# Patient Record
Sex: Male | Born: 1965 | Race: White | Hispanic: No | Marital: Married | Smoking: Former smoker
Health system: Southern US, Community
[De-identification: ages and names within clinical notes are randomized; demographics above are authoritative.]

## PROBLEM LIST (undated history)

## (undated) DIAGNOSIS — E785 Hyperlipidemia, unspecified: Secondary | ICD-10-CM

## (undated) DIAGNOSIS — G43909 Migraine, unspecified, not intractable, without status migrainosus: Secondary | ICD-10-CM

## (undated) DIAGNOSIS — J45909 Unspecified asthma, uncomplicated: Secondary | ICD-10-CM

## (undated) DIAGNOSIS — I10 Essential (primary) hypertension: Secondary | ICD-10-CM

## (undated) DIAGNOSIS — B029 Zoster without complications: Secondary | ICD-10-CM

## (undated) DIAGNOSIS — K219 Gastro-esophageal reflux disease without esophagitis: Secondary | ICD-10-CM

## (undated) DIAGNOSIS — F17200 Nicotine dependence, unspecified, uncomplicated: Secondary | ICD-10-CM

## (undated) DIAGNOSIS — K5792 Diverticulitis of intestine, part unspecified, without perforation or abscess without bleeding: Secondary | ICD-10-CM

## (undated) HISTORY — DX: Hyperlipidemia, unspecified: E78.5

## (undated) HISTORY — DX: Unspecified asthma, uncomplicated: J45.909

## (undated) HISTORY — DX: Essential (primary) hypertension: I10

## (undated) HISTORY — DX: Diverticulitis of intestine, part unspecified, without perforation or abscess without bleeding: K57.92

## (undated) HISTORY — DX: Gastro-esophageal reflux disease without esophagitis: K21.9

## (undated) HISTORY — DX: Nicotine dependence, unspecified, uncomplicated: F17.200

## (undated) HISTORY — DX: Migraine, unspecified, not intractable, without status migrainosus: G43.909

## (undated) HISTORY — DX: Zoster without complications: B02.9

---
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
## 2001-03-20 ENCOUNTER — Other Ambulatory Visit: Admission: RE | Admit: 2001-03-20 | Discharge: 2001-03-20 | Payer: Self-pay | Admitting: Family Medicine

## 2003-08-07 ENCOUNTER — Emergency Department (HOSPITAL_COMMUNITY): Admission: EM | Admit: 2003-08-07 | Discharge: 2003-08-07 | Payer: Self-pay | Admitting: Family Medicine

## 2003-11-09 ENCOUNTER — Ambulatory Visit (HOSPITAL_COMMUNITY): Admission: RE | Admit: 2003-11-09 | Discharge: 2003-11-09 | Payer: Self-pay | Admitting: Family Medicine

## 2004-08-15 ENCOUNTER — Ambulatory Visit: Payer: Self-pay | Admitting: Internal Medicine

## 2004-10-13 ENCOUNTER — Encounter: Payer: Self-pay | Admitting: Internal Medicine

## 2004-10-13 ENCOUNTER — Ambulatory Visit: Payer: Self-pay | Admitting: Internal Medicine

## 2004-10-13 ENCOUNTER — Ambulatory Visit (HOSPITAL_COMMUNITY): Admission: RE | Admit: 2004-10-13 | Discharge: 2004-10-13 | Payer: Self-pay | Admitting: Internal Medicine

## 2005-03-13 ENCOUNTER — Ambulatory Visit: Payer: Self-pay | Admitting: Internal Medicine

## 2006-03-14 ENCOUNTER — Ambulatory Visit: Payer: Self-pay | Admitting: Internal Medicine

## 2006-04-19 ENCOUNTER — Ambulatory Visit (HOSPITAL_COMMUNITY): Admission: RE | Admit: 2006-04-19 | Discharge: 2006-04-19 | Payer: Self-pay | Admitting: General Surgery

## 2009-06-19 ENCOUNTER — Emergency Department (HOSPITAL_COMMUNITY): Admission: EM | Admit: 2009-06-19 | Discharge: 2009-06-19 | Payer: Self-pay | Admitting: Emergency Medicine

## 2010-04-03 LAB — BASIC METABOLIC PANEL
BUN: 10 mg/dL (ref 6–23)
CO2: 28 mEq/L (ref 19–32)
Calcium: 9.4 mg/dL (ref 8.4–10.5)
Chloride: 104 mEq/L (ref 96–112)
Creatinine, Ser: 1.17 mg/dL (ref 0.4–1.5)
GFR calc Af Amer: >60 mL/min (ref >60)
GFR calc non Af Amer: >60 mL/min (ref >60)
Glucose, Bld: 82 mg/dL (ref 70–99)
Potassium: 3.6 mEq/L (ref 3.5–5.1)
Sodium: 139 mEq/L (ref 135–145)

## 2010-04-03 LAB — DIFFERENTIAL
Basophils Absolute: 0.1 10*3/uL (ref 0.0–0.1)
Basophils Relative: 1 % (ref 0–1)
Eosinophils Absolute: 0.2 10*3/uL (ref 0.0–0.7)
Eosinophils Relative: 2 % (ref 0–5)
Lymphocytes Relative: 25 % (ref 12–46)
Lymphs Abs: 1.8 10*3/uL (ref 0.7–4.0)
Monocytes Absolute: 0.8 10*3/uL (ref 0.1–1.0)
Monocytes Relative: 10 % (ref 3–12)
Neutro Abs: 4.5 10*3/uL (ref 1.7–7.7)
Neutrophils Relative %: 61 % (ref 43–77)

## 2010-04-03 LAB — CBC
HCT: 35.8 % — ABNORMAL LOW (ref 39.0–52.0)
Hemoglobin: 11.6 g/dL — ABNORMAL LOW (ref 13.0–17.0)
MCHC: 32.3 g/dL (ref 30.0–36.0)
MCV: 79.1 fL (ref 78.0–100.0)
Platelets: 250 10*3/uL (ref 150–400)
RBC: 4.53 MIL/uL (ref 4.22–5.81)
RDW: 15 % (ref 11.5–15.5)
WBC: 7.3 10*3/uL (ref 4.0–10.5)

## 2010-04-03 LAB — POCT CARDIAC MARKERS
CKMB, poc: <1 ng/mL — ABNORMAL LOW (ref 1.0–8.0)
Myoglobin, poc: 64 ng/mL (ref 12–200)
Troponin i, poc: <0.05 ng/mL (ref 0.00–0.09)
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
## 2010-06-02 NOTE — Op Note (Signed)
NAME:  Noah Wright, Noah Wright                 ACCOUNT NO.:  [id number]   MEDICAL RECORD NO.:  [id number]          PATIENT TYPE:  AMB   LOCATION:  DAY                           FACILITY:  [facility]   PHYSICIAN:  R. Roetta Sessions, M.D. DATE OF BIRTH:  1965/08/15   DATE OF PROCEDURE:  10/13/2004  DATE OF DISCHARGE:                                 OPERATIVE REPORT   PROCEDURES:  Diagnostic EGD followed by colonoscopy with snare polypectomy.   INDICATIONS FOR PROCEDURE:  The patient is a 45 year old gentleman with  longstanding gastroesophageal reflux disease and vague intermittent  esophageal dysphagia.  He also has intermittent low volume hematochezia.  He  was seen in our office the first of August but put off the endoscopic  evaluation until now.   EGD and colonoscopy have been discussed with Noah Wright at length.  Potential risks, benefits, and alternatives have been reviewed, questions  answered.  He is agreeable.  Please see the documentation in the medical  record.   PROCEDURE NOTE:  O2 saturations, blood pressure, pulse, and respirations  were monitored throughout the entire procedure.  Conscious sedation of IV  Versed and Demerol in incremental doses.  Cetacaine spray for topical  pharyngeal anesthesia.   INSTRUMENT:  Olympus video chip system.   FINDINGS:  EG examination of the tubular esophagus revealed 4 quadrant  distal esophageal erosions coming up a good 5-6 cm in tubal esophagus.  EG  junction was patulous.  There was no Barrett's esophagus, no evidence of  tumor, ring, or stricture.  EG junction was easily traversed with the scope.   STOMACH:  The gastric cavity was emptied, insufflated well with air.  A  thorough examination of the gastric mucosa, including retroflexed view of  the proximal stomach and esophagogastric junction demonstrated only a small  hiatal hernia.  The pylorus patent, easily traversed.  Examination of the  bulb and second portion revealed bulbar erosions,  otherwise D1, D2 appeared  normal.   THERAPY/DIAGNOSTIC MANEUVERS PERFORMED:  None.   The patient tolerated the procedure well and was prepared for colonoscopy.   Digital rectal exam revealed no abnormalities.   ENDOSCOPIC FINDINGS:  The prep was good.   RECTUM:  Examination of the rectal mucosa including retroflexed view in the  anal verge revealed a 5 mm pedunculated polyp at 15 cm in from the anal  verge; otherwise the rectal mucosa appeared normal.   COLON:  Colonic mucosa was surveyed from the rectosigmoid junction through  the left transverse and right colon to the area of the appendiceal orifice,  ileocecal valve, and cecum.  These structures were well-seen and  photographed for the record.  From this level, the scope was slowly  withdrawn.  The previously mentioned mucosal surfaces, I did not see any  clot; mucosa appeared normal.  The polyp in the rectum was removed with  snare cautery, recovered through the scope.  The patient tolerated both  procedures well, was reacted in endoscopy.   IMPRESSION:  1.  EGD, extensive distal esophageal erosions, consistent with moderately      severe erosive  reflux esophagitis, patulous esophagogastric junction, a      small hiatal hernia, otherwise normal stomach.  Bulbar erosions,      otherwise normal D1, D2.  2.  Colonoscopy findings:  Pedunculated polyp in the rectum removed with      snare cautery.  Otherwise, normal rectum, normal colon.   RECOMMENDATIONS:  1.  No aspirin or arthritis medications for 10 days.  2.  Antireflux literature provided to Noah Wright.  3.  Begin Aciphex 20 mg orally every day before breakfast.  He is to go by      my office for a sheet of free samples.  Prescription will be given.  No      aspirin or arthritis medications for 10 days.  4.  Follow up on path.  5.  Further recommendations to follow.  6.  Follow up appointment with me in the office in 3 months.      Jonathon Bellows, M.D.   Electronically Signed     RMR/MEDQ  D:  10/13/2004  T:  10/13/2004  Job:  [id number]   cc:   Angus G. Renard Matter, MD  Fax: [phone]
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
## 2011-03-09 ENCOUNTER — Other Ambulatory Visit: Payer: Self-pay | Admitting: Physician Assistant

## 2011-03-09 ENCOUNTER — Ambulatory Visit
Admission: RE | Admit: 2011-03-09 | Discharge: 2011-03-09 | Disposition: A | Discharge status: Home / Self Care | Payer: 59 | Source: Ambulatory Visit | Attending: Physician Assistant | Admitting: Physician Assistant

## 2011-03-09 DIAGNOSIS — R1033 Periumbilical pain: Secondary | ICD-10-CM

## 2012-06-27 ENCOUNTER — Encounter: Payer: Self-pay | Admitting: Family Medicine

## 2012-06-27 ENCOUNTER — Ambulatory Visit (INDEPENDENT_AMBULATORY_CARE_PROVIDER_SITE_OTHER): Payer: 59 | Admitting: Family Medicine

## 2012-06-27 VITALS — BP 110/78 | HR 64 | Temp 98.0°F | Resp 18 | Ht 71.0 in | Wt 208.0 lb

## 2012-06-27 DIAGNOSIS — F17200 Nicotine dependence, unspecified, uncomplicated: Secondary | ICD-10-CM | POA: Insufficient documentation

## 2012-06-27 DIAGNOSIS — B029 Zoster without complications: Secondary | ICD-10-CM

## 2012-06-27 HISTORY — DX: Nicotine dependence, unspecified, uncomplicated: F17.200

## 2012-06-27 MED ORDER — HYDROCODONE-ACETAMINOPHEN 10-325 MG PO TABS: 1.0000 | ORAL_TABLET | Freq: Three times a day (TID) | ORAL | Status: DC | PRN

## 2012-06-27 MED ORDER — VALACYCLOVIR HCL 1 G PO TABS: 1000.0000 mg | ORAL_TABLET | Freq: Three times a day (TID) | ORAL | Status: DC

## 2012-06-27 NOTE — Progress Notes (Signed)
  Subjective:    Patient ID: Noah Wright, male    DOB: [birth year], 47 y.o.   MRN: [id number]  HPI patient recently had an upper respiratory infection. He is also a smoker. 4 days ago, while battling his upper respiratory infection, he developed a burning searing rash on his right flank around the level of T12. The rash is characterized by erythematous papules and vesicles in a linear distribution and dermatomal pattern.  He is having significant pain that keeps him from sleeping. Past Medical History  Diagnosis Date  . Smoker 06/27/2012   No current outpatient prescriptions on file prior to visit.   No current facility-administered medications on file prior to visit.   No Known Allergies History   Social History  . Marital Status: Married    Spouse Name: N/A    Number of Children: N/A  . Years of Education: N/A   Occupational History  . Not on file.   Social History Main Topics  . Smoking status: Current Some Day Smoker    Types: Cigarettes  . Smokeless tobacco: Not on file  . Alcohol Use: No  . Drug Use: No  . Sexually Active: Not on file   Other Topics Concern  . Not on file   Social History Narrative  . No narrative on file      Review of Systems  All other systems reviewed and are negative.       Objective:   Physical Exam  Vitals reviewed. Cardiovascular: Normal rate.   Pulmonary/Chest: Effort normal. He has wheezes.  Skin: Rash noted.  See description above. The patient has a shingles-like rash in the T12 right dermatome.        Assessment & Plan:  Smoker  Shingles  I prescribed Valtrex 1000 mg by mouth 3 times a day for 7 days. Also gave patient prescription for Norco 10/325 1/2-1 tablet by mouth every 8 hours when necessary pain. Followup in one week if no better.

## 2012-07-07 ENCOUNTER — Telehealth: Payer: Self-pay | Admitting: Family Medicine

## 2012-07-07 MED ORDER — HYDROCODONE-ACETAMINOPHEN 10-325 MG PO TABS: 1.0000 | ORAL_TABLET | Freq: Three times a day (TID) | ORAL | Status: DC | PRN

## 2012-07-07 NOTE — Telephone Encounter (Signed)
Patient aware.

## 2012-07-07 NOTE — Telephone Encounter (Signed)
He can certainly have more vicodin 5/325 q 4 hrs prn #30.  More valtrex will not help at this point.

## 2012-07-23 ENCOUNTER — Telehealth: Payer: Self-pay | Admitting: Family Medicine

## 2012-07-23 MED ORDER — HYDROCODONE-ACETAMINOPHEN 10-325 MG PO TABS: 1.0000 | ORAL_TABLET | Freq: Three times a day (TID) | ORAL | Status: DC | PRN

## 2012-07-23 NOTE — Telephone Encounter (Signed)
Pt aware and is coming in on Thurs.

## 2012-07-23 NOTE — Telephone Encounter (Signed)
Bumps should not last this long usually.  He can certainly have the norco(10/325, #30) refilled but I 'd like to see him again if rash is present.

## 2012-07-24 ENCOUNTER — Encounter: Payer: Self-pay | Admitting: Family Medicine

## 2012-07-24 ENCOUNTER — Ambulatory Visit (INDEPENDENT_AMBULATORY_CARE_PROVIDER_SITE_OTHER): Payer: 59 | Admitting: Family Medicine

## 2012-07-24 VITALS — BP 130/80 | HR 68 | Temp 97.9°F | Resp 16 | Wt 213.0 lb

## 2012-07-24 DIAGNOSIS — R1900 Intra-abdominal and pelvic swelling, mass and lump, unspecified site: Secondary | ICD-10-CM

## 2012-07-24 DIAGNOSIS — B0229 Other postherpetic nervous system involvement: Secondary | ICD-10-CM

## 2012-07-24 MED ORDER — HYDROCODONE-ACETAMINOPHEN 10-325 MG PO TABS: 1.0000 | ORAL_TABLET | Freq: Three times a day (TID) | ORAL | Status: DC | PRN

## 2012-07-24 NOTE — Progress Notes (Signed)
  Subjective:    Patient ID: Noah Wright, male    DOB: [birth year], 47 y.o.   MRN: [id number]  HPI 06/27/12 patient recently had an upper respiratory infection. He is also a smoker. 4 days ago, while battling his upper respiratory infection, he developed a burning searing rash on his right flank around the level of T12. The rash is characterized by erythematous papules and vesicles in a linear distribution and dermatomal pattern.  He is having significant pain that keeps him from sleeping.  I diagnosed him with Shingles and our plan was: I prescribed Valtrex 1000 mg by mouth 3 times a day for 7 days. Also gave patient prescription for Norco 10/325 1/2-1 tablet by mouth every 8 hours when necessary pain. Followup in one week if no better.  07/24/12 Patient continues to have pain in the T12-L2 dermatome.  Fortunately the rash has disappeared and there is no evidence patient is immunocompromised.  There is some residual scarring. It hurts to move, to bend, to breathe in that area. He also has a palpable mass approximately centimeters in diameter underneath his right rib cage. It is difficult to determine if this is the liver or possibly a hernia.   Past Medical History  Diagnosis Date  . Smoker 06/27/2012   Current Outpatient Prescriptions on File Prior to Visit  Medication Sig Dispense Refill  . HYDROcodone-acetaminophen (NORCO) 10-325 MG per tablet Take 1 tablet by mouth every 8 (eight) hours as needed for pain.  30 tablet  0  . valACYclovir (VALTREX) 1000 MG tablet Take 1 tablet (1,000 mg total) by mouth 3 (three) times daily.  21 tablet  0   No current facility-administered medications on file prior to visit.   No Known Allergies History   Social History  . Marital Status: Married    Spouse Name: N/A    Number of Children: N/A  . Years of Education: N/A   Occupational History  . Not on file.   Social History Main Topics  . Smoking status: Current Some Day Smoker    Types: Cigarettes   . Smokeless tobacco: Not on file  . Alcohol Use: No  . Drug Use: No  . Sexually Active: Not on file   Other Topics Concern  . Not on file   Social History Narrative  . No narrative on file      Review of Systems  All other systems reviewed and are negative.       Objective:   Physical Exam  Vitals reviewed. Cardiovascular: Normal rate.   Pulmonary/Chest: Effort normal.  Abdominal: Soft. Bowel sounds are normal. He exhibits mass (see description above). He exhibits no distension. There is no tenderness. There is no rebound and no guarding.  Skin: No rash noted.          Assessment & Plan:  1. Post herpetic neuralgia Refill Norco 10/325 one by mouth every 8 hours when necessary. Gave the patient 30 pills. If the pain persist I would try Lyrica.  2. Abdominal mass Cannot determine if this is hepatomegaly versus a hernia versus a mass. I will obtain imaging to determine if it is pathologic. - CT Abdomen Pelvis W Contrast; Future

## 2012-08-05 ENCOUNTER — Other Ambulatory Visit: Payer: Self-pay | Admitting: Family Medicine

## 2012-08-05 DIAGNOSIS — G8929 Other chronic pain: Secondary | ICD-10-CM

## 2012-08-08 ENCOUNTER — Encounter: Payer: Self-pay | Admitting: Family Medicine

## 2012-08-08 NOTE — Telephone Encounter (Signed)
This encounter was created in error - please disregard.

## 2012-08-11 ENCOUNTER — Other Ambulatory Visit: Payer: 59

## 2012-08-11 ENCOUNTER — Ambulatory Visit
Admission: RE | Admit: 2012-08-11 | Discharge: 2012-08-11 | Disposition: A | Discharge status: Home / Self Care | Payer: 59 | Source: Ambulatory Visit | Attending: Family Medicine | Admitting: Family Medicine

## 2012-08-11 DIAGNOSIS — G8929 Other chronic pain: Secondary | ICD-10-CM

## 2012-08-11 DIAGNOSIS — R1011 Right upper quadrant pain: Secondary | ICD-10-CM

## 2012-08-18 ENCOUNTER — Telehealth: Payer: Self-pay | Admitting: Family Medicine

## 2012-08-18 NOTE — Progress Notes (Signed)
.  Patient aware and if increase in size or gets painful will call to set up appt.

## 2012-08-18 NOTE — Telephone Encounter (Signed)
Spoke to pt in office

## 2013-01-02 ENCOUNTER — Emergency Department (HOSPITAL_COMMUNITY)
Admission: EM | Admit: 2013-01-02 | Discharge: 2013-01-02 | Discharge status: Against Medical Advice | Payer: 59 | Attending: Emergency Medicine | Admitting: Emergency Medicine

## 2013-01-02 DIAGNOSIS — F17200 Nicotine dependence, unspecified, uncomplicated: Secondary | ICD-10-CM | POA: Insufficient documentation

## 2013-01-02 DIAGNOSIS — I10 Essential (primary) hypertension: Secondary | ICD-10-CM | POA: Insufficient documentation

## 2013-01-02 NOTE — ED Notes (Signed)
Pt stated he was fine and his blood pressure was good.  Will follow up with his pmd.

## 2013-01-02 NOTE — ED Notes (Signed)
Pt was trying to donate blood today, was told by the [facility] to get his blood pressure checked at the hospital. Pt denies any symptoms.

## 2013-01-26 ENCOUNTER — Telehealth: Payer: Self-pay | Admitting: Physician Assistant

## 2013-01-26 NOTE — Telephone Encounter (Signed)
NTBS, no record of this in the chart or an OV in the last 10 months.

## 2013-01-26 NOTE — Telephone Encounter (Signed)
Pt told [facility].  appt made for Wednesday.

## 2013-01-26 NOTE — Telephone Encounter (Signed)
Needs refill on Metronidazole 500 mgs for diverticulitis. Call him and let him know [phone]

## 2013-01-27 ENCOUNTER — Ambulatory Visit (INDEPENDENT_AMBULATORY_CARE_PROVIDER_SITE_OTHER): Payer: 59 | Admitting: Family Medicine

## 2013-01-27 ENCOUNTER — Encounter: Payer: Self-pay | Admitting: Family Medicine

## 2013-01-27 VITALS — BP 142/90 | HR 78 | Temp 98.4°F | Resp 18 | Ht 69.5 in | Wt 213.0 lb

## 2013-01-27 DIAGNOSIS — K5732 Diverticulitis of large intestine without perforation or abscess without bleeding: Secondary | ICD-10-CM

## 2013-01-27 DIAGNOSIS — R062 Wheezing: Secondary | ICD-10-CM

## 2013-01-27 DIAGNOSIS — R109 Unspecified abdominal pain: Secondary | ICD-10-CM

## 2013-01-27 DIAGNOSIS — K5792 Diverticulitis of intestine, part unspecified, without perforation or abscess without bleeding: Secondary | ICD-10-CM | POA: Insufficient documentation

## 2013-01-27 LAB — URINALYSIS, ROUTINE W REFLEX MICROSCOPIC
Bilirubin Urine: NEGATIVE
Glucose, UA: NEGATIVE mg/dL
Hgb urine dipstick: NEGATIVE
Ketones, ur: NEGATIVE mg/dL
Nitrite: NEGATIVE
Protein, ur: NEGATIVE mg/dL
Specific Gravity, Urine: 1.02 (ref 1.005–1.030)
Urobilinogen, UA: 0.2 mg/dL (ref 0.0–1.0)
pH: 5.5 (ref 5.0–8.0)

## 2013-01-27 LAB — CBC W/MCH & 3 PART DIFF
HCT: 49.3 % (ref 39.0–52.0)
Hemoglobin: 15.3 g/dL (ref 13.0–17.0)
Lymphocytes Relative: 19 % (ref 12–46)
Lymphs Abs: 2.5 10*3/uL (ref 0.7–4.0)
MCH: 28 pg (ref 26.0–34.0)
MCHC: 31 g/dL (ref 30.0–36.0)
MCV: 90.3 fL (ref 78.0–100.0)
Neutro Abs: 9.1 10*3/uL — ABNORMAL HIGH (ref 1.7–7.7)
Neutrophils Relative %: 67 % (ref 43–77)
Platelets: 213 10*3/uL (ref 150–400)
RBC: 5.46 MIL/uL (ref 4.22–5.81)
RDW: 15 % (ref 11.5–15.5)
WBC mixed population %: 14 % (ref 3–18)
WBC mixed population: 1.8 10*3/uL (ref 0.1–1.8)
WBC: 13.4 10*3/uL — ABNORMAL HIGH (ref 4.0–10.5)

## 2013-01-27 LAB — COMPREHENSIVE METABOLIC PANEL
ALT: 19 U/L (ref 0–53)
AST: 12 U/L (ref 0–37)
Albumin: 3.9 g/dL (ref 3.5–5.2)
Alkaline Phosphatase: 64 U/L (ref 39–117)
BUN: 12 mg/dL (ref 6–23)
CO2: 31 mEq/L (ref 19–32)
Calcium: 9.4 mg/dL (ref 8.4–10.5)
Chloride: 103 mEq/L (ref 96–112)
Creat: 1.09 mg/dL (ref 0.50–1.35)
Glucose, Bld: 92 mg/dL (ref 70–99)
Potassium: 4.9 mEq/L (ref 3.5–5.3)
Sodium: 141 mEq/L (ref 135–145)
Total Bilirubin: 0.5 mg/dL (ref 0.3–1.2)
Total Protein: 7 g/dL (ref 6.0–8.3)

## 2013-01-27 LAB — URINALYSIS, MICROSCOPIC ONLY
Casts: NONE SEEN
Crystals: NONE SEEN

## 2013-01-27 MED ORDER — CIPROFLOXACIN HCL 500 MG PO TABS: 500.0000 mg | ORAL_TABLET | Freq: Two times a day (BID) | ORAL | Status: DC

## 2013-01-27 MED ORDER — HYDROCODONE-ACETAMINOPHEN 5-325 MG PO TABS: 1.0000 | ORAL_TABLET | Freq: Four times a day (QID) | ORAL | Status: DC | PRN

## 2013-01-27 MED ORDER — METRONIDAZOLE 500 MG PO TABS: 500.0000 mg | ORAL_TABLET | Freq: Three times a day (TID) | ORAL | Status: DC

## 2013-01-27 NOTE — Assessment & Plan Note (Signed)
I think his symptoms are more associated with diverticulitis flare. I will start him on Cipro Flagyl and he has been given hydrocodone for pain. He will be on a liquid diet for the next 3 days. If this does not improve he will be sent for CT of abdomen and pelvis

## 2013-01-27 NOTE — Progress Notes (Signed)
   Subjective:    Patient ID: Noah Wright, male    DOB: September 10, 1965, 48 y.o.   MRN: [id number]  HPI  Patient here with abdominal pain for the past 3 days. He has history of diverticulitis which was negative a CT scan in 2013. He did have an early colonoscopy due to some GI problems and reflux where he had an EGD done as well as colonoscopy in September 2006 which was unremarkable. He states he was eating popcorn Friday and subsequently the next day he began having symptoms similar to his previous flare. He's had loose stools multiple times a day but no blood in the stools. He also has symptoms suprapubic pressure and discomfort when he urinates but no burning sensation or hematuria. He's not had any nausea vomiting. He states that the pain worsened to where he can feel it in his lower thoracic region. He did take a few doses of Flagyl which was from 2013.   Review of Systems  GEN- denies fatigue, fever, weight loss,weakness, recent illness HEENT- denies eye drainage, change in vision, nasal discharge, CVS- denies chest pain, palpitations RESP- denies SOB, cough, wheeze ABD- denies N/V, change in stools,+ abd pain GU- denies dysuria, hematuria, dribbling, incontinence Neuro- denies headache, dizziness, syncope, seizure activity      Objective:   Physical Exam  GEN- NAD, alert and oriented x3 HEENT- PERRL, EOMI, non injected sclera, pink conjunctiva, MMM, oropharynx clear CVS- RRR, no murmur RESP-scattered wheeze, normal , no rhonchi ABD-NABS,soft, TTP LLQ, Suprapubic region, no CVA tenderness,  MSK- Spine NT, no paraspinal tenderness,good ROM EXT- No edema Pulses- Radial 2+        Assessment & Plan:

## 2013-01-27 NOTE — Patient Instructions (Signed)
Start antibiotics Clear liquids for the next 3 days, Pain meds Call if not improved and CT will be done  Diverticulitis A diverticulum is a small pouch or sac on the colon. Diverticulosis is the presence of these diverticula on the colon. Diverticulitis is the irritation (inflammation) or infection of diverticula. CAUSES  The colon and its diverticula contain bacteria. If food particles block the tiny opening to a diverticulum, the bacteria inside can grow and cause an increase in pressure. This leads to infection and inflammation and is called diverticulitis. SYMPTOMS   Abdominal pain and tenderness. Usually, the pain is located on the left side of your abdomen. However, it could be located elsewhere.  Fever.  Bloating.  Feeling sick to your stomach (nausea).  Throwing up (vomiting).  Abnormal stools. DIAGNOSIS  Your caregiver will take a history and perform a physical exam. Since many things can cause abdominal pain, other tests may be necessary. Tests may include:  Blood tests.  Urine tests.  X-ray of the abdomen.  CT scan of the abdomen. Sometimes, surgery is needed to determine if diverticulitis or other conditions are causing your symptoms. TREATMENT  Most of the time, you can be treated without surgery. Treatment includes:  Resting the bowels by only having liquids for a few days. As you improve, you will need to eat a low-fiber diet.  Intravenous (IV) fluids if you are losing body fluids (dehydrated).  Antibiotic medicines that treat infections may be given.  Pain and nausea medicine, if needed.  Surgery if the inflamed diverticulum has burst. HOME CARE INSTRUCTIONS   Try a clear liquid diet (broth, tea, or water for as long as directed by your caregiver). You may then gradually begin a low-fiber diet as tolerated.  A low-fiber diet is a diet with less than 10 grams of fiber. Choose the foods below to reduce fiber in the diet:  White breads, cereals, rice,  and pasta.  Cooked fruits and vegetables or soft fresh fruits and vegetables without the skin.  Ground or well-cooked tender beef, ham, veal, lamb, pork, or poultry.  Eggs and seafood.  After your diverticulitis symptoms have improved, your caregiver may put you on a high-fiber diet. A high-fiber diet includes 14 grams of fiber for every 1000 calories consumed. For a standard 2000 calorie diet, you would need 28 grams of fiber. Follow these diet guidelines to help you increase the fiber in your diet. It is important to slowly increase the amount fiber in your diet to avoid gas, constipation, and bloating.  Choose whole-grain breads, cereals, pasta, and brown rice.  Choose fresh fruits and vegetables with the skin on. Do not overcook vegetables because the more vegetables are cooked, the more fiber is lost.  Choose more nuts, seeds, legumes, dried peas, beans, and lentils.  Look for food products that have greater than 3 grams of fiber per serving on the Nutrition Facts label.  Take all medicine as directed by your caregiver.  If your caregiver has given you a follow-up appointment, it is very important that you go. Not going could result in lasting (chronic) or permanent injury, pain, and disability. If there is any problem keeping the appointment, call to reschedule. SEEK MEDICAL CARE IF:   Your pain does not improve.  You have a hard time advancing your diet beyond clear liquids.  Your bowel movements do not return to normal. SEEK IMMEDIATE MEDICAL CARE IF:   Your pain becomes worse.  You have an oral temperature above 102  F (38.9 C), not controlled by medicine.  You have repeated vomiting.  You have bloody or black, tarry stools.  Symptoms that brought you to your caregiver become worse or are not getting better. MAKE SURE YOU:   Understand these instructions.  Will watch your condition.  Will get help right away if you are not doing well or get worse. Document  Released: 10/11/2004 Document Revised: 03/26/2011 Document Reviewed: 02/06/2010 [facility] Patient Information 2014 [place], [place].

## 2013-01-27 NOTE — Assessment & Plan Note (Signed)
He states he's had wheezing all of his life but has never been truly diagnosed with anything. He was on what sounds like Advair in the remote past. I would recommend pulmonary function tests which can be done at a later date.

## 2013-01-28 ENCOUNTER — Ambulatory Visit: Payer: Self-pay | Admitting: Physician Assistant

## 2013-02-10 ENCOUNTER — Encounter: Payer: Self-pay | Admitting: Family Medicine

## 2013-02-10 ENCOUNTER — Ambulatory Visit (INDEPENDENT_AMBULATORY_CARE_PROVIDER_SITE_OTHER): Payer: 59 | Admitting: Family Medicine

## 2013-02-10 VITALS — BP 120/80 | HR 78 | Temp 98.3°F | Resp 18 | Ht 71.0 in | Wt 213.0 lb

## 2013-02-10 DIAGNOSIS — Z0000 Encounter for general adult medical examination without abnormal findings: Secondary | ICD-10-CM

## 2013-02-10 DIAGNOSIS — Z23 Encounter for immunization: Secondary | ICD-10-CM

## 2013-02-10 NOTE — Addendum Note (Signed)
Addended by: Legrand RamsWILLIS, SANDY B on: 02/10/2013 08:57 AM   Modules accepted: Orders

## 2013-02-10 NOTE — Progress Notes (Signed)
Subjective:    Patient ID: Noah Wright, male    DOB: 12-23-1965, 48 y.o.   MRN: [id number]  HPI Patient is here for complete physical exam. He had a colonoscopy less than 5 years ago. He is due today for a prostate exam. He is also due today for influenza vaccine.  He has eaten this morning therefore I cannot draw blood work at the present time. He denies any trouble urinating. He has no family history of prostate cancer. He continues to smoke one pack per day. His blood pressure has been highly variable. Today it is 120/80. He recently had his blood drawn at 150/100. Past Medical History  Diagnosis Date  . Smoker 06/27/2012   Patient has had a cholecystectomy No current outpatient prescriptions on file prior to visit.   No current facility-administered medications on file prior to visit.   No Known Allergies History   Social History  . Marital Status: Married    Spouse Name: N/A    Number of Children: N/A  . Years of Education: N/A   Occupational History  . Not on file.   Social History Main Topics  . Smoking status: Current Some Day Smoker    Types: Cigarettes  . Smokeless tobacco: Not on file  . Alcohol Use: No  . Drug Use: No  . Sexual Activity: Not on file   Other Topics Concern  . Not on file   Social History Narrative  . No narrative on file   family history is negative for .colon cancer or prostate cancer    Review of Systems  All other systems reviewed and are negative.       Objective:   Physical Exam  Vitals reviewed. Constitutional: He is oriented to person, place, and time. He appears well-developed and well-nourished. No distress.  HENT:  Head: Normocephalic and atraumatic.  Right Ear: External ear normal.  Left Ear: External ear normal.  Nose: Nose normal.  Mouth/Throat: Oropharynx is clear and Wright. No oropharyngeal exudate.  Eyes: Conjunctivae and EOM are normal. Pupils are equal, round, and reactive to light. Right eye exhibits no  discharge. Left eye exhibits no discharge. No scleral icterus.  Neck: Normal range of motion. Neck supple. No JVD present. No tracheal deviation present. No thyromegaly present.  Cardiovascular: Normal rate, regular rhythm, normal heart sounds and intact distal pulses.  Exam reveals no gallop and no friction rub.   No murmur heard. Pulmonary/Chest: Effort normal and breath sounds normal. No stridor. No respiratory distress. He has no wheezes. He has no rales. He exhibits no tenderness.  Abdominal: Soft. Bowel sounds are normal. He exhibits no distension and no mass. There is no tenderness. There is no rebound and no guarding.  Genitourinary: Rectum normal, prostate normal and penis normal.  Musculoskeletal: Normal range of motion. He exhibits no edema and no tenderness.  Lymphadenopathy:    He has no cervical adenopathy.  Neurological: He is alert and oriented to person, place, and time. He has normal reflexes. He displays normal reflexes. No cranial nerve deficit. He exhibits normal muscle tone. Coordination normal.  Skin: Skin is warm. No rash noted. He is not diaphoretic. No erythema. No pallor.  Psychiatric: He has a normal mood and affect. His behavior is normal. Judgment and thought content normal.          Assessment & Plan:  1. Routine general medical examination at a health care facility Patient's colon cancer screening is up to date. He received his influenza  vaccine today. I have asked the patient to return fasting for a CBC, CMP, fasting lipid panel, and PSA. I have also asked him to check his blood pressure daily at home. He is to notify me if it is greater than 140/90 on consistent basis. I also recommended smoking cessation. - CBC with Differential; Future - COMPLETE METABOLIC PANEL WITH GFR; Future - Lipid panel; Future - PSA; Future

## 2013-02-23 ENCOUNTER — Other Ambulatory Visit: Payer: 59

## 2013-02-23 DIAGNOSIS — Z0000 Encounter for general adult medical examination without abnormal findings: Secondary | ICD-10-CM

## 2013-03-24 ENCOUNTER — Ambulatory Visit: Payer: 59 | Admitting: Family Medicine

## 2013-03-26 ENCOUNTER — Encounter: Payer: Self-pay | Admitting: Family Medicine

## 2013-03-26 ENCOUNTER — Ambulatory Visit (INDEPENDENT_AMBULATORY_CARE_PROVIDER_SITE_OTHER): Payer: 59 | Admitting: Family Medicine

## 2013-03-26 ENCOUNTER — Ambulatory Visit: Payer: 59 | Admitting: Family Medicine

## 2013-03-26 VITALS — BP 128/72 | HR 78 | Temp 98.0°F | Resp 18 | Ht 71.0 in | Wt 216.0 lb

## 2013-03-26 DIAGNOSIS — E785 Hyperlipidemia, unspecified: Secondary | ICD-10-CM

## 2013-03-26 DIAGNOSIS — R109 Unspecified abdominal pain: Secondary | ICD-10-CM

## 2013-03-26 NOTE — Progress Notes (Signed)
   Subjective:    Patient ID: Noah Wright, male    DOB: [birth year], 48 y.o.   MRN: [id number]  HPI Patient has a history of diverticulitis. He was recently treated with antibiotics for diverticulitis. The left quadrant abdominal pain subsided. However after you finish the antibiotics he developed excessive gas and bloating. He also developed a vague pressure-like discomfort in his lower abdomen. He has no tenderness to palpation in his left lower quadrant. He denies any melena or hematochezia. He denies any fevers or chills. He denies any exacerbating or alleviating factors. He also has a discuss his lab work. He was found to have a low HDL cholesterol, and elevated LDL cholesterol at 154. His HDL is 34. His past medical history is concave the fact that he smokes. Past Medical History  Diagnosis Date  . Smoker 06/27/2012  . Hyperlipidemia    No current outpatient prescriptions on file prior to visit.   No current facility-administered medications on file prior to visit.   No Known Allergies History   Social History  . Marital Status: Married    Spouse Name: N/A    Number of Children: N/A  . Years of Education: N/A   Occupational History  . Not on file.   Social History Main Topics  . Smoking status: Current Some Day Smoker    Types: Cigarettes  . Smokeless tobacco: Not on file  . Alcohol Use: No  . Drug Use: No  . Sexual Activity: Not on file   Other Topics Concern  . Not on file   Social History Narrative  . No narrative on file      Review of Systems  All other systems reviewed and are negative.       Objective:   Physical Exam  Vitals reviewed. Constitutional: He appears well-developed and well-nourished.  Neck: No thyromegaly present.  Cardiovascular: Normal rate, regular rhythm and normal heart sounds.  Exam reveals no gallop and no friction rub.   No murmur heard. Pulmonary/Chest: Effort normal and breath sounds normal. No respiratory distress. He has no  wheezes. He has no rales. He exhibits no tenderness.  Abdominal: Soft. Bowel sounds are normal. He exhibits no distension. There is no tenderness. There is no rebound and no guarding.          Assessment & Plan:  1. Abdominal pain, unspecified site I believe the patient has developed dyspepsia due to the antibiotics..I doubt there is smoldering diverticulitis.  I recommended restora 1 by mouth daily. Also recommended a high-fiber diet.  Recheck in 2 weeks if no better or sooner if worse.  2. HLD (hyperlipidemia) Patient's LDL cholesterol is less than 130. I recommended Lipitor given his smoking history. The patient like to try therapeutic lifestyle changes, diet, and exercise prior study medication. Recheck fasting lipid panel in 6 months. His LDL is greater than 130 in 6 months also the patient with. I again counseled smoking cessation.

## 2013-04-13 ENCOUNTER — Encounter: Payer: Self-pay | Admitting: Family Medicine

## 2013-04-16 ENCOUNTER — Telehealth: Payer: Self-pay | Admitting: Family Medicine

## 2013-04-16 NOTE — Telephone Encounter (Signed)
Start diovan 80 mg poqday and recheck bp in 1 month.

## 2013-04-16 NOTE — Telephone Encounter (Signed)
Pt has been keeping track of BP's and they have been running high and he was wondering what to do about them????   136/100,147/98,130/87,144/110,145/90

## 2013-04-17 MED ORDER — VALSARTAN 80 MG PO TABS: 80.0000 mg | ORAL_TABLET | Freq: Every day | ORAL | Status: DC

## 2013-04-17 NOTE — Telephone Encounter (Signed)
Med called to pharm and pt aware 

## 2013-04-30 ENCOUNTER — Ambulatory Visit (INDEPENDENT_AMBULATORY_CARE_PROVIDER_SITE_OTHER): Payer: 59 | Admitting: Physician Assistant

## 2013-04-30 ENCOUNTER — Encounter: Payer: Self-pay | Admitting: Physician Assistant

## 2013-04-30 VITALS — BP 144/100 | HR 56 | Temp 97.4°F | Resp 18 | Ht 70.75 in | Wt 214.0 lb

## 2013-04-30 DIAGNOSIS — R309 Painful micturition, unspecified: Secondary | ICD-10-CM

## 2013-04-30 DIAGNOSIS — R300 Dysuria: Secondary | ICD-10-CM

## 2013-04-30 DIAGNOSIS — N200 Calculus of kidney: Secondary | ICD-10-CM

## 2013-04-30 LAB — URINALYSIS, ROUTINE W REFLEX MICROSCOPIC
Bilirubin Urine: NEGATIVE
Glucose, UA: NEGATIVE mg/dL
Ketones, ur: NEGATIVE mg/dL
Leukocytes, UA: NEGATIVE
Nitrite: NEGATIVE
Protein, ur: 100 mg/dL — AB
Specific Gravity, Urine: >1.03 — ABNORMAL HIGH (ref 1.005–1.030)
Urobilinogen, UA: 0.2 mg/dL (ref 0.0–1.0)
pH: 5.5 (ref 5.0–8.0)

## 2013-04-30 LAB — URINALYSIS, MICROSCOPIC ONLY
Casts: NONE SEEN
Crystals: NONE SEEN

## 2013-04-30 MED ORDER — CIPROFLOXACIN HCL 500 MG PO TABS: 500.0000 mg | ORAL_TABLET | Freq: Two times a day (BID) | ORAL | Status: DC

## 2013-04-30 MED ORDER — TAMSULOSIN HCL 0.4 MG PO CAPS: 0.4000 mg | ORAL_CAPSULE | Freq: Every day | ORAL | Status: DC

## 2013-04-30 NOTE — Progress Notes (Signed)
Patient ID: Noah Wright MRN: [id number], DOB: April 19, 1965, 48 y.o. Date of Encounter: @DATE @  Chief Complaint:  Chief Complaint  Patient presents with  . painful urination    urgency/pressure x 2 days    HPI: 48 y.o. year old white male  presents with the following complaints:  Says that 2 days ago he noticed urinary frequency. Says that he was constantly having to go to the bathroom to urinate and only small amount would come out. Yesterday symptoms were better. Says this morning he began to notice some pain on the right side of his lower back. He points to approximate T 11 - T12 level on the right. Says that at its worst the pain was about a 4 - 5 / 10.    He states that he took an Aleve for this pain at 8:00 AM. Appointment with me was 11:15.  He said that while he was sitting in our waiting room he was feeling significant pain in that area. However right now during the visit with me he says the pain is now decreased. (c/w colic)  Also states that 2 days ago at the tip of the penis at the tip of the urethra --that area was tender. Says that area has not been tender since then. He has had no penile discharge. Says that he has only had one sexual partner for a long time now - says he has no concern of STD. He never had any dysuria. Says that area was tender to the touch 2 days ago.  Has had no fevers or chills.  No pain along the flank. Had no pain in the abdomen at all including the right lower quadrant or the suprapubic area. Has had no pain in the suprapubic/perirectal area where you would expect prostate pain to be.  He reports no known history of kidney stones. Reports he has never had symptoms like this before.   Past Medical History  Diagnosis Date  . Smoker 06/27/2012  . Hyperlipidemia      Home Meds: See attached medication section for current medication list. Any medications entered into computer today will not appear on this note's list. The medications listed  below were entered prior to today. Current Outpatient Prescriptions on File Prior to Visit  Medication Sig Dispense Refill  . valsartan (DIOVAN) 80 MG tablet Take 1 tablet (80 mg total) by mouth daily.  30 tablet  3   No current facility-administered medications on file prior to visit.    Allergies: No Known Allergies  History   Social History  . Marital Status: Married    Spouse Name: N/A    Number of Children: N/A  . Years of Education: N/A   Occupational History  . Not on file.   Social History Main Topics  . Smoking status: Current Some Day Smoker    Types: Cigarettes  . Smokeless tobacco: Not on file  . Alcohol Use: No  . Drug Use: No  . Sexual Activity: Not on file   Other Topics Concern  . Not on file   Social History Narrative  . No narrative on file    No family history on file.   Review of Systems:  See HPI for pertinent ROS. All other ROS negative.    Physical Exam: Blood pressure 144/100, pulse 56, temperature 97.4 F (36.3 C), temperature source Oral, resp. rate 18, height 5' 10.75" (1.797 m), weight 214 lb (97.07 kg)., Body mass index is 30.06 kg/(m^2). General: WNWD  WM. Appears in no acute distress. Neck: Supple. No thyromegaly. No lymphadenopathy. Lungs: Clear bilaterally to auscultation without wheezes, rales, or rhonchi. Breathing is unlabored. Heart: RRR with S1 S2. No murmurs, rubs, or gallops. Abdomen: Soft, non-tender, non-distended with normoactive bowel sounds. No hepatomegaly. No rebound/guarding. No obvious abdominal masses.NO area of tenderness- including suprapubic area, Right Lower Quadrant/flank.  Musculoskeletal:  Strength and tone normal for age. Back: No costophrenic angle tenderness with percussion bilaterally. He points to right back at about T11- T12 level as area of discomfort. Extremities/Skin: Warm and dry. Neuro: Alert and oriented X 3. Moves all extremities spontaneously. Gait is normal. CNII-XII grossly in tact. Psych:   Responds to questions appropriately with a normal affect.   Results for orders placed in visit on 04/30/13  URINALYSIS, ROUTINE W REFLEX MICROSCOPIC      Result Value Ref Range   Color, Urine YELLOW  YELLOW   APPearance CLEAR  CLEAR   Specific Gravity, Urine >1.030 (*) 1.005 - 1.030   pH 5.5  5.0 - 8.0   Glucose, UA NEG  NEG mg/dL   Bilirubin Urine NEG  NEG   Ketones, ur NEG  NEG mg/dL   Hgb urine dipstick SMALL (*) NEG   Protein, ur 100 (*) NEG mg/dL   Urobilinogen, UA 0.2  0.0 - 1.0 mg/dL   Nitrite NEG  NEG   Leukocytes, UA NEG  NEG  URINALYSIS, MICROSCOPIC ONLY      Result Value Ref Range   Squamous Epithelial / LPF RARE  RARE   Crystals NONE SEEN  NONE SEEN   Casts NONE SEEN  NONE SEEN   WBC, UA 0-2  <3 WBC/hpf   RBC / HPF 3-6 (*) <3 RBC/hpf   Bacteria, UA RARE  RARE     ASSESSMENT AND PLAN:  48 y.o. year old male with  1. Nephrolithiasis - tamsulosin (FLOMAX) 0.4 MG CAPS capsule; Take 1 capsule (0.4 mg total) by mouth daily.  Dispense: 30 capsule; Refill: 0 - ciprofloxacin (CIPRO) 500 MG tablet; Take 1 tablet (500 mg total) by mouth 2 (two) times daily.  Dispense: 20 tablet; Refill: 0  2. Painful urination - Urinalysis, Routine w reflex microscopic  Discussed that at present the only thing that his history, exam, and urinalysis are all consistent with is nephrolithiasis. Discussed obtaining a CT scan. He defers scan at this time. Told him to go ahead and start the Flomax and the Cipro and take these as directed. He is to call us if his pain worsens or if he develops any new/different symptoms.  Signed, [address]Mary Beth Buena VistaDixon, [place], [facility] 04/30/2013 1:55 PM

## 2013-08-07 ENCOUNTER — Telehealth: Payer: Self-pay | Admitting: *Deleted

## 2013-08-07 MED ORDER — ATORVASTATIN CALCIUM 20 MG PO TABS: 20.0000 mg | ORAL_TABLET | Freq: Every day | ORAL | Status: DC

## 2013-08-07 NOTE — Telephone Encounter (Signed)
rx printed and given to pt. 

## 2013-08-07 NOTE — Telephone Encounter (Signed)
Pt called stating he is needing refill on lipitor for 90 days supple per his insurance, pt needs it written out to send to pharmacy mail order.

## 2013-08-19 ENCOUNTER — Other Ambulatory Visit: Payer: Self-pay | Admitting: Family Medicine

## 2013-08-19 MED ORDER — ATORVASTATIN CALCIUM 20 MG PO TABS: 20.0000 mg | ORAL_TABLET | Freq: Every day | ORAL | Status: DC

## 2013-08-19 NOTE — Telephone Encounter (Signed)
Med sent to pharm 

## 2013-08-31 ENCOUNTER — Encounter: Payer: Self-pay | Admitting: Family Medicine

## 2013-08-31 ENCOUNTER — Ambulatory Visit (INDEPENDENT_AMBULATORY_CARE_PROVIDER_SITE_OTHER): Payer: 59 | Admitting: Family Medicine

## 2013-08-31 ENCOUNTER — Ambulatory Visit (HOSPITAL_COMMUNITY)
Admission: RE | Admit: 2013-08-31 | Discharge: 2013-08-31 | Disposition: A | Discharge status: Home / Self Care | Payer: 59 | Source: Ambulatory Visit | Attending: Family Medicine | Admitting: Family Medicine

## 2013-08-31 VITALS — BP 136/88 | HR 80 | Temp 98.1°F | Resp 16 | Ht 71.0 in | Wt 218.0 lb

## 2013-08-31 DIAGNOSIS — R0609 Other forms of dyspnea: Secondary | ICD-10-CM

## 2013-08-31 DIAGNOSIS — R0789 Other chest pain: Secondary | ICD-10-CM

## 2013-08-31 DIAGNOSIS — R079 Chest pain, unspecified: Secondary | ICD-10-CM | POA: Diagnosis not present

## 2013-08-31 DIAGNOSIS — R0989 Other specified symptoms and signs involving the circulatory and respiratory systems: Secondary | ICD-10-CM

## 2013-08-31 DIAGNOSIS — R0602 Shortness of breath: Secondary | ICD-10-CM | POA: Insufficient documentation

## 2013-08-31 MED ORDER — ATORVASTATIN CALCIUM 20 MG PO TABS: 20.0000 mg | ORAL_TABLET | Freq: Every day | ORAL | Status: DC

## 2013-08-31 MED ORDER — VALSARTAN 80 MG PO TABS: 80.0000 mg | ORAL_TABLET | Freq: Every day | ORAL | Status: DC

## 2013-08-31 NOTE — Progress Notes (Signed)
Subjective:    Patient ID: Noah Wright, male    DOB: 1965/07/12, 48 y.o.   MRN: [id number]  HPI Patient is a 48 year old white male who is here today for medicine check. He is upset because he has been taking Lipitor for hyperlipidemia unbeknownst to him. His LDL cholesterol is elevated and due to his hypertension and smoking I recommended starting Lipitor. The patient wanted to try therapeutic lifestyle changes first. However my office called in lipitor.  He very upset by this. He's also taking Diovan 80 mg by mouth daily for hypertension. 2 months ago the patient quit smoking. Unfortunately his been getting atypical chest pain several times a week. The pain isn't related to exertion. It is located on the left side of the sternum and radiates up into his neck. He's also been getting tingling in his fingertips. It is unrelated to exercise. However he has noticed substantial and increasing dyspnea on exertion.  The pain lasts seconds to minutes and resolve spontaneously. He describes it as a pressure-like sensation Past Medical History  Diagnosis Date  . Smoker 06/27/2012  . Hyperlipidemia   . Hypertension    No past surgical history on file. Current Outpatient Prescriptions on File Prior to Visit  Medication Sig Dispense Refill  . atorvastatin (LIPITOR) 20 MG tablet Take 1 tablet (20 mg total) by mouth daily.  90 tablet  1  . valsartan (DIOVAN) 80 MG tablet Take 1 tablet (80 mg total) by mouth daily.  30 tablet  3   No current facility-administered medications on file prior to visit.   No Known Allergies History   Social History  . Marital Status: Married    Spouse Name: N/A    Number of Children: N/A  . Years of Education: N/A   Occupational History  . Not on file.   Social History Main Topics  . Smoking status: Current Some Day Smoker    Types: Cigarettes  . Smokeless tobacco: Not on file  . Alcohol Use: No  . Drug Use: No  . Sexual Activity: Not on file   Other Topics  Concern  . Not on file   Social History Narrative  . No narrative on file      Review of Systems  All other systems reviewed and are negative.      Objective:   Physical Exam  Vitals reviewed. Constitutional: He appears well-developed and well-nourished.  Neck: Neck supple. No JVD present.  Cardiovascular: Normal rate, regular rhythm and normal heart sounds.   No murmur heard. Pulmonary/Chest: Effort normal and breath sounds normal. No respiratory distress. He has no wheezes. He has no rales.  Abdominal: Soft. Bowel sounds are normal. He exhibits no distension. There is no tenderness. There is no rebound and no guarding.  Musculoskeletal: He exhibits no edema.  Lymphadenopathy:    He has no cervical adenopathy.  Psychiatric: His affect is angry.  Patient is upset at my nurse regarding his medicine list.          Assessment & Plan:  Dyspnea on exertion  Atypical chest pain  I apologized to the patient because he was upset by the fact that he is taking a cholesterol medicine without his knowledge. It was not my intention to place him on this medicine by subterfuge.  I believe that a 48 year-old male smoker with an LDL cholesterol greater than 150 with hypertension runs an increased risk of heart disease I still stand by my decision at place him on  Lipitor. However I feel we should focus our attention on his atypical chest pain. EKG shows normal sinus rhythm 84 beats per minute with normal intervals and normal axis. There is no obvious sign of ischemia or infarction.  His chest pain is atypical however given his age and his risk factors I cannot totally exclude ischemia. I will consult cardiology for a stress test. If the stress test is normal however I believe the patient's dyspnea on exertion is likely due to COPD. On his pulmonary function test today, patient's FEV1/FVC was 52%.  FEV1 was 43% of predicted.  This is stage III COPD and likely explains his dyspnea on exertion.   Schedule for stress test and if normal, I would recommend a trial of Stiolto.

## 2013-09-23 ENCOUNTER — Encounter: Payer: Self-pay | Admitting: Family Medicine

## 2013-10-13 ENCOUNTER — Encounter: Payer: Self-pay | Admitting: Interventional Cardiology

## 2013-10-13 ENCOUNTER — Ambulatory Visit (INDEPENDENT_AMBULATORY_CARE_PROVIDER_SITE_OTHER): Payer: 59 | Admitting: Interventional Cardiology

## 2013-10-13 VITALS — BP 120/80 | HR 58 | Ht 71.0 in | Wt 219.2 lb

## 2013-10-13 DIAGNOSIS — R062 Wheezing: Secondary | ICD-10-CM

## 2013-10-13 DIAGNOSIS — R0789 Other chest pain: Secondary | ICD-10-CM

## 2013-10-13 DIAGNOSIS — R0602 Shortness of breath: Secondary | ICD-10-CM

## 2013-10-13 NOTE — Patient Instructions (Signed)
Your physician has requested that you have en exercise stress myoview. For further information please visit [email or web address]. Please follow instruction sheet, as given.   

## 2013-10-13 NOTE — Progress Notes (Signed)
Patient ID: Noah Wright, male   DOB: [birth year], 48 y.o.   MRN: [id number]     Patient ID: Noah Wright MRN: [id number] DOB/AGE: [birth year] 48 y.o.   Referring Physician Dr. Tanya Nones   Reason for Consultation: chest pain  HPI: 48 y/o who has had HTN, treated with meds, and stage III COPD.  He has had some high cholesterol readings as well.  He was started on Lipitor.  He has had some chest pain.  He has never had a stress test.  CP does not come on with activity.  He has a h/o smoking for 30 years.  No regular walking.  He does hunt.  He feels fatigued, but no sx in his chest.  He was [facility].  No nausea, arm pain or sweating when he has his CP.   He was instructed to cut back on activities until the stress test but he has not done this.  Of note, he does not want to take statins if possible.   Current Outpatient Prescriptions  Medication Sig Dispense Refill  . valsartan (DIOVAN) 80 MG tablet Take 1 tablet (80 mg total) by mouth daily.  30 tablet  5   No current facility-administered medications for this visit.   Past Medical History  Diagnosis Date  . Smoker 06/27/2012  . Hyperlipidemia   . Hypertension     Family History  Problem Relation Age of Onset  . Hypertension Father     History   Social History  . Marital Status: Married    Spouse Name: N/A    Number of Children: N/A  . Years of Education: N/A   Occupational History  . Not on file.   Social History Main Topics  . Smoking status: Former Smoker -- 0.50 packs/day for 30 years    Types: Cigarettes  . Smokeless tobacco: Not on file  . Alcohol Use: No  . Drug Use: No  . Sexual Activity: Not on file   Other Topics Concern  . Not on file   Social History Narrative  . No narrative on file    No past surgical history on file.    (Not in a hospital admission)  Review of systems complete and found to be negative unless listed above .  No nausea, vomiting.  No fever chills, No focal weakness,  No  palpitations.  Physical Exam: Filed Vitals:   10/13/13 1451  BP: 120/80  Pulse: 58    Weight: 219 lb 3.2 oz (99.428 kg)  Physical exam:  [place]/AT EOMI No JVD, No carotid bruit RRR S1S2  Bilateral wheezing Soft. NT, nondistended No edema. No focal motor or sensory deficits Normal affect  Labs:   Lab Results  Component Value Date   WBC 13.4* 01/27/2013   HGB 15.3 01/27/2013   HCT 49.3 01/27/2013   MCV 90.3 01/27/2013   PLT 213 01/27/2013   No results found for this basename: NA, K, CL, CO2, BUN, CREATININE, CALCIUM, LABALBU, PROT, BILITOT, ALKPHOS, ALT, AST, GLUCOSE,  in the last 168 hours No results found for this basename: CKTOTAL,  CKMB,  CKMBINDEX,  TROPONINI    No results found for this basename: CHOL   No results found for this basename: HDL   No results found for this basename: LDLCALC   No results found for this basename: TRIG   No results found for this basename: CHOLHDL   No results found for this basename: LDLDIRECT       EKG:  Normal sinus rhythm,  nonspecific ST segment and T-wave flattening. 1 mm ST elevation in V2 and V3- most likely related to early repolarization  ASSESSMENT AND PLAN:   Chest discomfort: Some typical and some atypical features. ECG would not be able to be interpreted for ischemia. We'll plan for nuclear stress testing. Given risk factors for CAD including long history of smoking, need to evaluate for ischemia.  We discussed cardiac cath. He would be willing to do this at the stress test is abnormal or if his symptoms persist. HTN: Control today. Continue current medicines. Wheezing: I think it is okay to start Advair per his primary care Dr. Per the patient, this was the plan. They were initially waiting until his cardiac evaluation is done. The patient is actively wheezing today. Advair will not interfere with his cardiac evaluation. He has stopped smoking.  Signed:   Fredric MareJay S. Ramy Greth, MD,  [facility] 10/14/2013, 11:05 AM

## 2013-10-20 ENCOUNTER — Other Ambulatory Visit: Payer: Self-pay | Admitting: Cardiology

## 2013-10-23 ENCOUNTER — Ambulatory Visit (HOSPITAL_COMMUNITY): Payer: 59 | Attending: Internal Medicine | Admitting: Radiology

## 2013-10-23 VITALS — BP 129/85 | HR 50 | Ht 71.0 in | Wt 219.0 lb

## 2013-10-23 DIAGNOSIS — R0602 Shortness of breath: Secondary | ICD-10-CM | POA: Insufficient documentation

## 2013-10-23 DIAGNOSIS — R079 Chest pain, unspecified: Secondary | ICD-10-CM | POA: Diagnosis present

## 2013-10-23 DIAGNOSIS — R0789 Other chest pain: Secondary | ICD-10-CM

## 2013-10-23 DIAGNOSIS — I10 Essential (primary) hypertension: Secondary | ICD-10-CM | POA: Diagnosis not present

## 2013-10-23 MED ORDER — TECHNETIUM TC 99M SESTAMIBI GENERIC - CARDIOLITE
30.0000 | Freq: Once | INTRAVENOUS | Status: AC | PRN
  Administered 2013-10-23: 30 via INTRAVENOUS

## 2013-10-23 MED ORDER — TECHNETIUM TC 99M SESTAMIBI GENERIC - CARDIOLITE
10.0000 | Freq: Once | INTRAVENOUS | Status: AC | PRN
  Administered 2013-10-23: 10 via INTRAVENOUS

## 2013-10-23 NOTE — Progress Notes (Signed)
[facility] S[facility] N[facility]LE[facility] MED [address] [place], [place] [address] [phone]    Cardiology Nuclear Med Study  Noah Wright is a 48 y.o. male     MRN : [id number]     DOB: [birth year]  Procedure Date: 10/23/2013  Nuclear Med Background Indication for Stress Test:  Evaluation for Ischemia and Abnormal EKG History:  no prior cardiac hx Cardiac Risk Factors: Hypertension  Symptoms:  Chest Pain (last date of chest discomfort was earlier this week) and SOB   Nuclear Pre-Procedure Caffeine/Decaff Intake:  None NPO After: 7:30pm   Lungs:  Expiratory wheezing O2 Sat: 98% on room air. IV 0.9% NS with Angio Cath:  20g  IV Site: R Hand  IV Started by:  Frederick Peerseresa Johnson, EMT-P  Chest Size (in):  46 Cup Size: n/a  Height: 5\' 11"  (1.803 m)  Weight:  219 lb (99.338 kg)  BMI:  Body mass index is 30.56 kg/(m^2). Tech Comments:  n/a    Nuclear Med Study 1 or 2 day study: 1 day  Stress Test Type:  Stress  Reading MD: Charlton HawsPeter Won Kreuzer, MD  Order Authorizing Provider:  Catalina GravelJ. Varanasi, MD  Resting Radionuclide: Technetium 6192m Sestamibi  Resting Radionuclide Dose: 11.0 mCi   Stress Radionuclide:  Technetium 5792m Sestamibi  Stress Radionuclide Dose: 33.0 mCi           Stress Protocol Rest HR: 50 Stress HR: 164  Rest BP: 129/85 Stress BP: 206/98  Exercise Time (min): 7:00 METS: 8.4           Dose of Adenosine (mg):  n/a Dose of Lexiscan: n/a mg  Dose of Atropine (mg): n/a Dose of Dobutamine: n/a mcg/kg/min (at max HR)  Stress Test Technologist: Nelson ChimesSharon Brooks, BS-ES  Nuclear Technologist:  Doyne Keelonya Yount, CNMT     Rest Procedure:  Myocardial perfusion imaging was performed at rest 45 minutes following the intravenous administration of Technetium 8392m Sestamibi. Rest ECG: NSR - Normal EKG  Stress Procedure:  The patient exercised on the treadmill utilizing the Bruce Protocol for 7:00 minutes. The patient stopped due to fatigue and SOB.  Patient had audible wheezing with exercise. and  denied any chest pain.  Technetium 492m Sestamibi was injected at peak exercise and myocardial perfusion imaging was performed after a brief delay. Stress ECG: No significant change from baseline ECG  QPS Raw Data Images:  Normal; no motion artifact; normal heart/lung ratio. Stress Images:  Normal homogeneous uptake in all areas of the myocardium. Rest Images:  Normal homogeneous uptake in all areas of the myocardium. Subtraction (SDS):  Normal Transient Ischemic Dilatation (Normal <1.22):  1.00 Lung/Heart Ratio (Normal <0.45):  0.24  Quantitative Gated Spect Images QGS EDV:  141 ml QGS ESV:  87 ml  Impression Exercise Capacity:  Fair exercise capacity. BP Response:  Hypertensive blood pressure response. Clinical Symptoms:  Wheezing ECG Impression:  No significant ST segment change suggestive of ischemia. Comparison with Prior Nuclear Study: No images to compare  Overall Impression:  Low risk stress nuclear study Normal perfusion images  Surface images suggest global hypokinesis with moderate decrease in EF  Suggest echo or MRI correlatoin.  LV Ejection Fraction: 38%.  LV Wall Motion:  See above    Charlton HawsPeter Alexx Mcburney

## 2013-10-28 ENCOUNTER — Telehealth: Payer: Self-pay | Admitting: Cardiology

## 2013-10-28 DIAGNOSIS — I519 Heart disease, unspecified: Secondary | ICD-10-CM

## 2013-10-28 NOTE — Telephone Encounter (Signed)
Message copied by Theda SersSTEGALL, Jarae Panas H on Wed Oct 28, 2013 11:33 AM ------      Message from: Corky CraftsVARANASI, JAYADEEP S      Created: Mon Oct 26, 2013  5:25 PM       Perfusion to heart appears normal but function is mildly decreased.  Check echo to evaluate for LV dysfunction. ------

## 2013-10-28 NOTE — Telephone Encounter (Signed)
Pt notified. Echo ordered.  ?

## 2013-10-29 ENCOUNTER — Other Ambulatory Visit (HOSPITAL_COMMUNITY): Payer: 59

## 2013-10-30 ENCOUNTER — Ambulatory Visit (HOSPITAL_COMMUNITY): Payer: 59 | Attending: Cardiovascular Disease | Admitting: Radiology

## 2013-10-30 DIAGNOSIS — E785 Hyperlipidemia, unspecified: Secondary | ICD-10-CM | POA: Insufficient documentation

## 2013-10-30 DIAGNOSIS — R0602 Shortness of breath: Secondary | ICD-10-CM | POA: Diagnosis not present

## 2013-10-30 DIAGNOSIS — R0789 Other chest pain: Secondary | ICD-10-CM | POA: Diagnosis present

## 2013-10-30 DIAGNOSIS — I10 Essential (primary) hypertension: Secondary | ICD-10-CM | POA: Diagnosis not present

## 2013-10-30 DIAGNOSIS — I519 Heart disease, unspecified: Secondary | ICD-10-CM

## 2013-10-30 DIAGNOSIS — R062 Wheezing: Secondary | ICD-10-CM

## 2013-10-30 NOTE — Progress Notes (Signed)
Echocardiogram performed.  

## 2013-11-03 NOTE — Progress Notes (Signed)
Patient informed of echo results

## 2013-11-11 ENCOUNTER — Telehealth: Payer: Self-pay

## 2013-11-11 NOTE — Telephone Encounter (Signed)
Patient informed of echocardiogram results.  

## 2014-01-12 ENCOUNTER — Other Ambulatory Visit: Payer: Self-pay | Admitting: Family Medicine

## 2014-01-12 NOTE — Telephone Encounter (Signed)
Medication refilled per protocol. 

## 2014-04-23 ENCOUNTER — Other Ambulatory Visit: Payer: Self-pay | Admitting: Family Medicine

## 2014-12-28 ENCOUNTER — Other Ambulatory Visit: Payer: Self-pay | Admitting: Family Medicine

## 2014-12-28 ENCOUNTER — Encounter: Payer: Self-pay | Admitting: Family Medicine

## 2014-12-28 NOTE — Telephone Encounter (Signed)
Medication refill for one time only.  Patient needs to be seen.  Letter sent for patient to call and schedule.  Pt has not been seen since 08/2013.

## 2015-05-20 ENCOUNTER — Ambulatory Visit (HOSPITAL_COMMUNITY)
Admission: RE | Admit: 2015-05-20 | Discharge: 2015-05-20 | Disposition: A | Discharge status: Home / Self Care | Payer: 59 | Source: Ambulatory Visit | Attending: Orthopedic Surgery | Admitting: Orthopedic Surgery

## 2015-05-20 ENCOUNTER — Other Ambulatory Visit (HOSPITAL_COMMUNITY): Payer: Self-pay | Admitting: Orthopedic Surgery

## 2015-05-20 DIAGNOSIS — M25561 Pain in right knee: Secondary | ICD-10-CM

## 2015-05-20 DIAGNOSIS — M25461 Effusion, right knee: Secondary | ICD-10-CM | POA: Diagnosis not present

## 2015-05-20 DIAGNOSIS — S83241A Other tear of medial meniscus, current injury, right knee, initial encounter: Secondary | ICD-10-CM | POA: Insufficient documentation

## 2015-05-20 DIAGNOSIS — X58XXXA Exposure to other specified factors, initial encounter: Secondary | ICD-10-CM | POA: Diagnosis not present

## 2015-05-20 DIAGNOSIS — M7121 Synovial cyst of popliteal space [Baker], right knee: Secondary | ICD-10-CM | POA: Insufficient documentation

## 2015-05-20 DIAGNOSIS — M7051 Other bursitis of knee, right knee: Secondary | ICD-10-CM | POA: Insufficient documentation

## 2015-08-03 ENCOUNTER — Other Ambulatory Visit: Payer: Self-pay | Admitting: Family Medicine

## 2016-09-14 ENCOUNTER — Ambulatory Visit (INDEPENDENT_AMBULATORY_CARE_PROVIDER_SITE_OTHER): Payer: 59 | Admitting: Primary Care

## 2016-09-14 ENCOUNTER — Encounter: Payer: Self-pay | Admitting: Primary Care

## 2016-09-14 VITALS — BP 162/102 | HR 71 | Temp 98.1°F | Ht 70.25 in | Wt 220.4 lb

## 2016-09-14 DIAGNOSIS — I10 Essential (primary) hypertension: Secondary | ICD-10-CM | POA: Diagnosis not present

## 2016-09-14 DIAGNOSIS — E669 Obesity, unspecified: Secondary | ICD-10-CM | POA: Diagnosis not present

## 2016-09-14 MED ORDER — LOSARTAN POTASSIUM 50 MG PO TABS: 50.0000 mg | ORAL_TABLET | Freq: Every day | ORAL | 0 refills | Status: DC

## 2016-09-14 NOTE — Progress Notes (Signed)
Subjective:    Patient ID: Noah Wright, male    DOB: 1965-11-30, 51 y.o.   MRN: [id number]  HPI  Noah Wright is a [age] year old male who presents today to establish care and discuss the problems mentioned below. Will obtain old records.  1) Essential Hypertension: Diagnosed two years ago and was previously managed on Valsartan 80 mg for which he's not taken for the past one month. His BP over the past 1 year has run 120's/70's on valsartan 80 mg. He denies chest pain, dizziness, shortness of breath.   2) Obesity: History of being overweight for years. He endorses a poor diet.   Diet currently consists of:  Breakfast: Fast food Lunch: Fast food, restaurant food Dinner: Chips, sandwich Snacks: Chips, crackers Desserts: None Beverages: Anheuser-BuschMountain Dew, no water.   Exercise: He does not currently exercise.    Review of Systems  Constitutional: Negative for fatigue.  Eyes: Negative for visual disturbance.  Respiratory: Negative for shortness of breath.   Cardiovascular: Negative for chest pain.  Endocrine: Negative for polydipsia and polyuria.  Neurological: Negative for dizziness.       Occasional headaches       Past Medical History:  Diagnosis Date  . Diverticulitis   . GERD (gastroesophageal reflux disease)   . Hyperlipidemia   . Hypertension   . Migraines   . Smoker 06/27/2012     Social History   Social History  . Marital status: Married    Spouse name: N/A  . Number of children: N/A  . Years of education: N/A   Occupational History  . Not on file.   Social History Main Topics  . Smoking status: Former Smoker    Packs/day: 0.50    Years: 30.00    Types: Cigarettes  . Smokeless tobacco: Never Used  . Alcohol use No  . Drug use: No  . Sexual activity: Not on file   Other Topics Concern  . Not on file   Social History Narrative   Married.       Past Surgical History:  Procedure Laterality Date  . DG GALL BLADDER  1981    Family History    Problem Relation Age of Onset  . Arthritis Mother   . Hearing loss Mother   . Heart disease Mother   . Hypertension Father   . Alcohol abuse Father   . Cancer Father        bladder/kidney  . Hearing loss Father   . Hyperlipidemia Father     Allergies  Allergen Reactions  . Lipitor [Atorvastatin] Other (See Comments)    Myalgias, weakness (20 mg)    No current outpatient prescriptions on file prior to visit.   No current facility-administered medications on file prior to visit.     BP (!) 162/102   Pulse 71   Temp 98.1 F (36.7 C) (Oral)   Ht 5' 10.25" (1.784 m)   Wt 220 lb 6.4 oz (100 kg)   SpO2 97%   BMI 31.40 kg/m    Objective:   Physical Exam  Constitutional: He is oriented to person, place, and time. He appears well-nourished.  Neck: Neck supple.  Cardiovascular: Normal rate and regular rhythm.   Pulmonary/Chest: Effort normal and breath sounds normal. He has no wheezes. He has no rales.  Neurological: He is alert and oriented to person, place, and time.  Skin: Skin is warm and dry.  Psychiatric: He has a normal mood and affect.  Assessment & Plan:

## 2016-09-14 NOTE — Assessment & Plan Note (Signed)
Discussed the importance of a healthy diet and regular exercise in order for weight loss, and to reduce the risk of other medical problems. Discussed to start exercising, 150 minutes of moderate intensity exercise weekly. Discussed to reduce fast food/fried food, increase vegetables/fruit/whole grains.

## 2016-09-14 NOTE — Assessment & Plan Note (Signed)
Valsartan recalled, has been off for the past 1 month. Rx for losartan 50 mg sent to pharmacy. Will have him monitor BP and return in 2-3 weeks for re-check and BMP. Discussed DASH diet.

## 2016-09-14 NOTE — Patient Instructions (Signed)
Start losartan 50 mg tablets for high blood pressure. Take 1 tablet by mouth once daily.   Check your blood pressure daily, around the same time of day, for the next 2 weeks.  Ensure that you have rested for 30 minutes prior to checking your blood pressure. Record your readings and bring them to your next visit.  Start exercising. You should be getting 150 minutes of moderate intensity exercise weekly.  It's important to improve your diet by reducing consumption of fast food, fried food, processed snack foods, sugary drinks. Increase consumption of fresh vegetables and fruits, whole grains, water.  Ensure you are drinking 64 ounces of water daily.  Schedule a follow up visit in 2-3 weeks for re-evaluation of blood pressure.  It was a pleasure to meet you today! Please don't hesitate to call me with any questions. Welcome to [facility]!   DASH Eating Plan DASH stands for "Dietary Approaches to Stop Hypertension." The DASH eating plan is a healthy eating plan that has been shown to reduce high blood pressure (hypertension). It may also reduce your risk for type 2 diabetes, heart disease, and stroke. The DASH eating plan may also help with weight loss. What are tips for following this plan? General guidelines  Avoid eating more than 2,300 mg (milligrams) of salt (sodium) a day. If you have hypertension, you may need to reduce your sodium intake to 1,500 mg a day.  Limit alcohol intake to no more than 1 drink a day for nonpregnant women and 2 drinks a day for men. One drink equals 12 oz of beer, 5 oz of wine, or 1 oz of hard liquor.  Work with your health care provider to maintain a healthy body weight or to lose weight. Ask what an ideal weight is for you.  Get at least 30 minutes of exercise that causes your heart to beat faster (aerobic exercise) most days of the week. Activities may include walking, swimming, or biking.  Work with your health care provider or diet and nutrition specialist  (dietitian) to adjust your eating plan to your individual calorie needs. Reading food labels  Check food labels for the amount of sodium per serving. Choose foods with less than 5 percent of the Daily Value of sodium. Generally, foods with less than 300 mg of sodium per serving fit into this eating plan.  To find whole grains, look for the word "whole" as the first word in the ingredient list. Shopping  Buy products labeled as "low-sodium" or "no salt added."  Buy fresh foods. Avoid canned foods and premade or frozen meals. Cooking  Avoid adding salt when cooking. Use salt-free seasonings or herbs instead of table salt or sea salt. Check with your health care provider or pharmacist before using salt substitutes.  Do not fry foods. Cook foods using healthy methods such as baking, boiling, grilling, and broiling instead.  Cook with heart-healthy oils, such as olive, canola, soybean, or sunflower oil. Meal planning   Eat a balanced diet that includes: ? 5 or more servings of fruits and vegetables each day. At each meal, try to fill half of your plate with fruits and vegetables. ? Up to 6-8 servings of whole grains each day. ? Less than 6 oz of lean meat, poultry, or fish each day. A 3-oz serving of meat is about the same size as a deck of cards. One egg equals 1 oz. ? 2 servings of low-fat dairy each day. ? A serving of nuts, seeds, or beans  5 times each week. ? Heart-healthy fats. Healthy fats called Omega-3 fatty acids are found in foods such as flaxseeds and coldwater fish, like sardines, salmon, and mackerel.  Limit how much you eat of the following: ? Canned or prepackaged foods. ? Food that is high in trans fat, such as fried foods. ? Food that is high in saturated fat, such as fatty meat. ? Sweets, desserts, sugary drinks, and other foods with added sugar. ? Full-fat dairy products.  Do not salt foods before eating.  Try to eat at least 2 vegetarian meals each week.  Eat  more home-cooked food and less restaurant, buffet, and fast food.  When eating at a restaurant, ask that your food be prepared with less salt or no salt, if possible. What foods are recommended? The items listed may not be a complete list. Talk with your dietitian about what dietary choices are best for you. Grains Whole-grain or whole-wheat bread. Whole-grain or whole-wheat pasta. Brown rice. Orpah Cobbatmeal. Quinoa. Bulgur. Whole-grain and low-sodium cereals. Pita bread. Low-fat, low-sodium crackers. Whole-wheat flour tortillas. Vegetables Fresh or frozen vegetables (raw, steamed, roasted, or grilled). Low-sodium or reduced-sodium tomato and vegetable juice. Low-sodium or reduced-sodium tomato sauce and tomato paste. Low-sodium or reduced-sodium canned vegetables. Fruits All fresh, dried, or frozen fruit. Canned fruit in natural juice (without added sugar). Meat and other protein foods Skinless chicken or Malawiturkey. Ground chicken or Mal[place]urkey. Pork with fat trimmed off. Fish and seafood. Egg whites. Dried beans, peas, or lentils. Unsalted nuts, nut butters, and seeds. Unsalted canned beans. Lean cuts of beef with fat trimmed off. Low-sodium, lean deli meat. Dairy Low-fat (1%) or fat-free (skim) milk. Fat-free, low-fat, or reduced-fat cheeses. Nonfat, low-sodium ricotta or cottage cheese. Low-fat or nonfat yogurt. Low-fat, low-sodium cheese. Fats and oils Soft margarine without trans fats. Vegetable oil. Low-fat, reduced-fat, or light mayonnaise and salad dressings (reduced-sodium). Canola, safflower, olive, soybean, and sunflower oils. Avocado. Seasoning and other foods Herbs. Spices. Seasoning mixes without salt. Unsalted popcorn and pretzels. Fat-free sweets. What foods are not recommended? The items listed may not be a complete list. Talk with your dietitian about what dietary choices are best for you. Grains Baked goods made with fat, such as croissants, muffins, or some breads. Dry pasta or rice meal  packs. Vegetables Creamed or fried vegetables. Vegetables in a cheese sauce. Regular canned vegetables (not low-sodium or reduced-sodium). Regular canned tomato sauce and paste (not low-sodium or reduced-sodium). Regular tomato and vegetable juice (not low-sodium or reduced-sodium). Rosita FirePickles. Olives. Fruits Canned fruit in a light or heavy syrup. Fried fruit. Fruit in cream or butter sauce. Meat and other protein foods Fatty cuts of meat. Ribs. Fried meat. Tomasa BlaseBacon. Sausage. Bologna and other processed lunch meats. Salami. Fatback. Hotdogs. Bratwurst. Salted nuts and seeds. Canned beans with added salt. Canned or smoked fish. Whole eggs or egg yolks. Chicken or Malaw[place]urkey with skin. Dairy Whole or 2% milk, cream, and half-and-half. Whole or full-fat cream cheese. Whole-fat or sweetened yogurt. Full-fat cheese. Nondairy creamers. Whipped toppings. Processed cheese and cheese spreads. Fats and oils Butter. Stick margarine. Lard. Shortening. Ghee. Bacon fat. Tropical oils, such as coconut, palm kernel, or palm oil. Seasoning and other foods Salted popcorn and pretzels. Onion salt, garlic salt, seasoned salt, table salt, and sea salt. Worcestershire sauce. Tartar sauce. Barbecue sauce. Teriyaki sauce. Soy sauce, including reduced-sodium. Steak sauce. Canned and packaged gravies. Fish sauce. Oyster sauce. Cocktail sauce. Horseradish that you find on the shelf. Ketchup. Mustard. Meat flavorings and tenderizers. Bouillon cubes.  Hot sauce and Tabasco sauce. Premade or packaged marinades. Premade or packaged taco seasonings. Relishes. Regular salad dressings. Where to find more information:  National Heart, Lung, and Blood Institute: PopSteam.is  American Heart Association: www.heart.org Summary  The DASH eating plan is a healthy eating plan that has been shown to reduce high blood pressure (hypertension). It may also reduce your risk for type 2 diabetes, heart disease, and stroke.  With the DASH eating  plan, you should limit salt (sodium) intake to 2,300 mg a day. If you have hypertension, you may need to reduce your sodium intake to 1,500 mg a day.  When on the DASH eating plan, aim to eat more fresh fruits and vegetables, whole grains, lean proteins, low-fat dairy, and heart-healthy fats.  Work with your health care provider or diet and nutrition specialist (dietitian) to adjust your eating plan to your individual calorie needs. This information is not intended to replace advice given to you by your health care provider. Make sure you discuss any questions you have with your health care provider. Document Released: 12/21/2010 Document Revised: 12/26/2015 Document Reviewed: 12/26/2015 Elsevier Interactive Patient Education  2017 [employer].

## 2016-10-01 ENCOUNTER — Ambulatory Visit: Payer: 59 | Admitting: Primary Care

## 2016-10-11 ENCOUNTER — Other Ambulatory Visit: Payer: Self-pay | Admitting: Primary Care

## 2016-10-11 DIAGNOSIS — I10 Essential (primary) hypertension: Secondary | ICD-10-CM

## 2017-07-11 ENCOUNTER — Ambulatory Visit: Payer: Managed Care, Other (non HMO) | Admitting: Family Medicine

## 2017-07-11 ENCOUNTER — Encounter: Payer: Self-pay | Admitting: Family Medicine

## 2017-07-11 VITALS — BP 138/84 | HR 52 | Temp 97.9°F | Ht 71.0 in | Wt 214.2 lb

## 2017-07-11 DIAGNOSIS — J479 Bronchiectasis, uncomplicated: Secondary | ICD-10-CM | POA: Diagnosis not present

## 2017-07-11 DIAGNOSIS — F17200 Nicotine dependence, unspecified, uncomplicated: Secondary | ICD-10-CM

## 2017-07-11 DIAGNOSIS — R109 Unspecified abdominal pain: Secondary | ICD-10-CM

## 2017-07-11 LAB — POC URINALSYSI DIPSTICK (AUTOMATED)
Bilirubin, UA: NEGATIVE
GLUCOSE UA: NEGATIVE
KETONES UA: NEGATIVE
Leukocytes, UA: NEGATIVE
Nitrite, UA: NEGATIVE
Protein, UA: NEGATIVE
RBC UA: NEGATIVE
Spec Grav, UA: 1.025 (ref 1.010–1.025)
Urobilinogen, UA: 0.2 E.U./dL
pH, UA: 6 (ref 5.0–8.0)

## 2017-07-11 NOTE — Assessment & Plan Note (Signed)
Longstanding R sided abd discomfort, progressively worsening. Not positional. H/o cholecystectomy. Check labs today, check abd US. Results will guide further treatment plan.  UA normal.

## 2017-07-11 NOTE — Patient Instructions (Addendum)
Labs today, urine today, we will schedule abdominal ultrasound - see our referral coordinators on your way out.  We will be in touch with results.  Work on quitting smoking.
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
## 2017-07-12 LAB — CBC WITH DIFFERENTIAL/PLATELET
BASOS: 1 %
Basophils Absolute: 0.1 10*3/uL (ref 0.0–0.2)
EOS (ABSOLUTE): 0.2 10*3/uL (ref 0.0–0.4)
Eos: 3 %
HEMATOCRIT: 41.6 % (ref 37.5–51.0)
HEMOGLOBIN: 13.6 g/dL (ref 13.0–17.7)
IMMATURE GRANS (ABS): 0 10*3/uL (ref 0.0–0.1)
IMMATURE GRANULOCYTES: 0 %
LYMPHS: 24 %
Lymphocytes Absolute: 2.3 10*3/uL (ref 0.7–3.1)
MCH: 26.4 pg — AB (ref 26.6–33.0)
MCHC: 32.7 g/dL (ref 31.5–35.7)
MCV: 81 fL (ref 79–97)
Monocytes Absolute: 1 10*3/uL — ABNORMAL HIGH (ref 0.1–0.9)
Monocytes: 11 %
NEUTROS PCT: 61 %
Neutrophils Absolute: 6 10*3/uL (ref 1.4–7.0)
Platelets: 291 10*3/uL (ref 150–450)
RBC: 5.16 x10E6/uL (ref 4.14–5.80)
RDW: 15.1 % (ref 12.3–15.4)
WBC: 9.6 10*3/uL (ref 3.4–10.8)

## 2017-07-12 LAB — COMPREHENSIVE METABOLIC PANEL
A/G RATIO: 1.8 (ref 1.2–2.2)
ALBUMIN: 4.5 g/dL (ref 3.5–5.5)
ALT: 17 IU/L (ref 0–44)
AST: 14 IU/L (ref 0–40)
Alkaline Phosphatase: 65 IU/L (ref 39–117)
BUN/Creatinine Ratio: 10 (ref 9–20)
BUN: 11 mg/dL (ref 6–24)
Bilirubin Total: 0.5 mg/dL (ref 0.0–1.2)
CALCIUM: 9.5 mg/dL (ref 8.7–10.2)
CO2: 26 mmol/L (ref 20–29)
Chloride: 107 mmol/L — ABNORMAL HIGH (ref 96–106)
Creatinine, Ser: 1.06 mg/dL (ref 0.76–1.27)
GFR calc Af Amer: 92 mL/min/{1.73_m2} (ref >59)
GFR, EST NON AFRICAN AMERICAN: 79 mL/min/{1.73_m2} (ref >59)
GLOBULIN, TOTAL: 2.5 g/dL (ref 1.5–4.5)
Glucose: 88 mg/dL (ref 65–99)
POTASSIUM: 5.1 mmol/L (ref 3.5–5.2)
Sodium: 144 mmol/L (ref 134–144)
Total Protein: 7 g/dL (ref 6.0–8.5)

## 2017-07-12 LAB — LIPASE: Lipase: 36 U/L (ref 13–78)

## 2017-07-15 ENCOUNTER — Ambulatory Visit (HOSPITAL_COMMUNITY)
Admission: RE | Admit: 2017-07-15 | Discharge: 2017-07-15 | Disposition: A | Discharge status: Home / Self Care | Payer: Managed Care, Other (non HMO) | Source: Ambulatory Visit | Attending: Family Medicine | Admitting: Family Medicine

## 2017-07-15 DIAGNOSIS — R109 Unspecified abdominal pain: Secondary | ICD-10-CM | POA: Insufficient documentation

## 2017-07-15 DIAGNOSIS — Z9049 Acquired absence of other specified parts of digestive tract: Secondary | ICD-10-CM | POA: Insufficient documentation

## 2017-07-26 ENCOUNTER — Telehealth: Payer: Self-pay | Admitting: Primary Care

## 2017-07-26 NOTE — Telephone Encounter (Signed)
Returning call  Copied from CRM [phone]. Topic: Quick Communication - Other Results >> Jul 17, 2017  4:26 PM Nanci PinaGoins, Felicia, CMA wrote: Called patient to inform them of 07/15/17 ultrasound results. When patient returns call, triage nurse may disclose results.

## 2017-12-20 ENCOUNTER — Telehealth: Payer: Self-pay | Admitting: Primary Care

## 2017-12-20 NOTE — Telephone Encounter (Signed)
error 

## 2017-12-24 ENCOUNTER — Ambulatory Visit: Payer: Managed Care, Other (non HMO) | Admitting: Primary Care

## 2017-12-24 ENCOUNTER — Encounter: Payer: Self-pay | Admitting: Primary Care

## 2017-12-24 VITALS — BP 142/100 | HR 65 | Temp 98.0°F | Ht 71.0 in | Wt 215.2 lb

## 2017-12-24 DIAGNOSIS — I10 Essential (primary) hypertension: Secondary | ICD-10-CM | POA: Diagnosis not present

## 2017-12-24 DIAGNOSIS — J069 Acute upper respiratory infection, unspecified: Secondary | ICD-10-CM

## 2017-12-24 DIAGNOSIS — J439 Emphysema, unspecified: Secondary | ICD-10-CM

## 2017-12-24 MED ORDER — LISINOPRIL 20 MG PO TABS: 20.0000 mg | ORAL_TABLET | Freq: Every day | ORAL | 0 refills | Status: DC

## 2017-12-24 MED ORDER — ALBUTEROL SULFATE HFA 108 (90 BASE) MCG/ACT IN AERS: 2.0000 | INHALATION_SPRAY | Freq: Four times a day (QID) | RESPIRATORY_TRACT | 0 refills | Status: DC | PRN

## 2017-12-24 MED ORDER — HYDROCHLOROTHIAZIDE 25 MG PO TABS: 25.0000 mg | ORAL_TABLET | Freq: Every day | ORAL | 0 refills | Status: DC

## 2017-12-24 NOTE — Patient Instructions (Addendum)
Start hydrochlorothiazide 25 mg tablets for high blood pressure. Take 1 tablet by mouth once daily.   Do not pick up the lisinopril.   Shortness of Breath/Wheezing/Cough: Use the albuterol inhaler. Inhale 2 puffs into the lungs every 4 to 6 hours as needed for wheezing, cough, and/or shortness of breath.   You can dry Delsym DM or Robitussin DM for cough and congestion.   Schedule a follow up visit in 2 weeks for blood pressure check.  It was a pleasure to see you today!

## 2017-12-24 NOTE — Assessment & Plan Note (Signed)
Untreated over the last 6 months as he did not request refills.  Losartan 50 mg on national back order.  Prescription for hydrochlorothiazide 25 mg sent to pharmacy for treatment.  Will avoid lisinopril given recent cough complaints and history of asthma.  Follow-up in 2 weeks for blood pressure check and BMP.

## 2017-12-24 NOTE — Assessment & Plan Note (Signed)
Noted on chest x-ray from 2015.  Also current smoker.  Exam today with mild wheezing throughout.  Albuterol inhaler sent to pharmacy.  Return precautions provided.  He does not appear sickly/acutely ill today.

## 2017-12-24 NOTE — Progress Notes (Signed)
Subjective:    Patient ID: Noah Wright, male    DOB: 1965-09-19, 52 y.o.   MRN: [id number]  HPI  Mr. Penning is a 52 year old male who presents today for follow up. He also reports a chief complaint of cough.  1) Essential Hypertension: Currently prescribed losartan 50 mg but he's not taken in 6 months as he ran out. Since then he's had intermittent headaches that has progressed over the last several months.   He has been checking his BP on occasion at home which runs 150's/100's.   BP Readings from Last 3 Encounters:  12/24/17 (!) 142/100  07/11/17 138/84  09/14/16 (!) 162/102    2) Cough: Thinks he had a spell of "diverticulitis" three weekends ago with symptoms of loose and frequent stools, right lower quadrant abdominal pain. His symptoms eventually improved with a more mild diet and rest. He was working out in the yard at that time and noticed cough, body aches.  The symptoms resolved.  Three days ago he noticed constant headache, deep cough, slight night sweats last night, mild wheezing, tightness into the lungs.  He thinks he was diagnosed with asthma as a child, is a current smoker.  He was taking Alka-seltzer, Tylenol PM with improvement but hasn't felt improved over the last three days. His cough is slightly productive with brown sputum.   Review of Systems  Constitutional: Negative for fever.  HENT: Positive for congestion. Negative for postnasal drip, sinus pressure and sore throat.   Respiratory: Positive for cough.   Neurological: Positive for headaches. Negative for dizziness.       Past Medical History:  Diagnosis Date  . Asthma   . Diverticulitis   . GERD (gastroesophageal reflux disease)   . Hyperlipidemia   . Hypertension   . Migraines   . Smoker 06/27/2012     Social History   Socioeconomic History  . Marital status: Married    Spouse name: Not on file  . Number of children: Not on file  . Years of education: Not on file  . Highest education  level: Not on file  Occupational History  . Not on file  Social Needs  . Financial resource strain: Not on file  . Food insecurity:    Worry: Not on file    Inability: Not on file  . Transportation needs:    Medical: Not on file    Non-medical: Not on file  Tobacco Use  . Smoking status: Light Tobacco Smoker    Packs/day: 0.50    Years: 30.00    Pack years: 15.00    Types: Cigarettes  . Smokeless tobacco: Never Used  Substance and Sexual Activity  . Alcohol use: No  . Drug use: No  . Sexual activity: Not on file  Lifestyle  . Physical activity:    Days per week: Not on file    Minutes per session: Not on file  . Stress: Not on file  Relationships  . Social connections:    Talks on phone: Not on file    Gets together: Not on file    Attends religious service: Not on file    Active member of club or organization: Not on file    Attends meetings of clubs or organizations: Not on file    Relationship status: Not on file  . Intimate partner violence:    Fear of current or ex partner: Not on file    Emotionally abused: Not on file  Physically abused: Not on file    Forced sexual activity: Not on file  Other Topics Concern  . Not on file  Social History Narrative   Married.   3 children.   Works in [employer].   Enjoys spending time outdoors.    Past Surgical History:  Procedure Laterality Date  . DG GALL BLADDER  1981    Family History  Problem Relation Age of Onset  . Arthritis Mother   . Hearing loss Mother   . Heart disease Mother   . Hypertension Father   . Alcohol abuse Father   . Cancer Father        bladder/kidney  . Hearing loss Father   . Hyperlipidemia Father     Allergies  Allergen Reactions  . Lipitor [Atorvastatin] Other (See Comments)    Myalgias, weakness (20 mg)    Current Outpatient Medications on File Prior to Visit  Medication Sig Dispense Refill  . albuterol (PROVENTIL HFA;VENTOLIN HFA) 108 (90 Base) MCG/ACT inhaler Inhale 2 puffs  into the lungs every 6 (six) hours as needed for wheezing or shortness of breath.    . losartan (COZAAR) 50 MG tablet TAKE 1 TABLET BY MOUTH EVERY DAY (Patient not taking: Reported on 12/24/2017) 90 tablet 1   No current facility-administered medications on file prior to visit.     BP (!) 142/100   Pulse 65   Temp 98 F (36.7 C) (Oral)   Ht 5\' 11"  (1.803 m)   Wt 215 lb 4 oz (97.6 kg)   SpO2 98%   BMI 30.02 kg/m    Objective:   Physical Exam  Constitutional: He appears well-nourished. He does not appear ill.  HENT:  Right Ear: Tympanic membrane and ear canal normal.  Left Ear: Tympanic membrane and ear canal normal.  Nose: No mucosal edema. Right sinus exhibits no maxillary sinus tenderness and no frontal sinus tenderness. Left sinus exhibits no maxillary sinus tenderness and no frontal sinus tenderness.  Mouth/Throat: Oropharynx is clear and moist.  Neck: Neck supple.  Cardiovascular: Normal rate and regular rhythm.  Respiratory: Effort normal. He has no decreased breath sounds. He has wheezes in the right upper field, the right lower field, the left upper field and the left lower field. He has no rhonchi.  Very mild wheezing throughout all fields.  Skin: Skin is warm and dry.           Assessment & Plan:

## 2018-01-06 ENCOUNTER — Ambulatory Visit: Payer: Managed Care, Other (non HMO) | Admitting: Primary Care

## 2018-01-06 DIAGNOSIS — Z0289 Encounter for other administrative examinations: Secondary | ICD-10-CM

## 2018-01-23 ENCOUNTER — Encounter: Payer: Self-pay | Admitting: Primary Care

## 2018-01-23 ENCOUNTER — Ambulatory Visit: Payer: Managed Care, Other (non HMO) | Admitting: Primary Care

## 2018-01-23 VITALS — BP 120/84 | HR 79 | Temp 97.9°F | Ht 71.0 in | Wt 218.0 lb

## 2018-01-23 DIAGNOSIS — I10 Essential (primary) hypertension: Secondary | ICD-10-CM | POA: Diagnosis not present

## 2018-01-23 LAB — COMPREHENSIVE METABOLIC PANEL
ALK PHOS: 66 U/L (ref 39–117)
ALT: 20 U/L (ref 0–53)
AST: 18 U/L (ref 0–37)
Albumin: 3.9 g/dL (ref 3.5–5.2)
BUN: 12 mg/dL (ref 6–23)
CO2: 31 mEq/L (ref 19–32)
CREATININE: 1.09 mg/dL (ref 0.40–1.50)
Calcium: 9.4 mg/dL (ref 8.4–10.5)
Chloride: 103 mEq/L (ref 96–112)
GFR: 74.66 mL/min (ref >60.00)
Glucose, Bld: 96 mg/dL (ref 70–99)
Potassium: 3.6 mEq/L (ref 3.5–5.1)
SODIUM: 140 meq/L (ref 135–145)
TOTAL PROTEIN: 7 g/dL (ref 6.0–8.3)
Total Bilirubin: 0.4 mg/dL (ref 0.2–1.2)

## 2018-01-23 MED ORDER — HYDROCHLOROTHIAZIDE 25 MG PO TABS: 25.0000 mg | ORAL_TABLET | Freq: Every day | ORAL | 3 refills | Status: DC

## 2018-01-23 NOTE — Patient Instructions (Signed)
Stop by the lab prior to leaving today. I will notify you of your results once received.   Continue taking hydrochlorothiazide 25 mg for blood pressure.  Please schedule a physical with me in 2 months. You may also schedule a lab only appointment 3-4 days prior. We will discuss your lab results in detail during your physical.  It was a pleasure to see you today!

## 2018-01-23 NOTE — Assessment & Plan Note (Signed)
Stable in the office today on HCTZ 25 mg, continue same.  BMP pending.

## 2018-01-23 NOTE — Progress Notes (Signed)
Subjective:    Patient ID: Noah Wright, male    DOB: 1965/09/30, 53 y.o.   MRN: [id number]  HPI  Noah Wright is a 53 year old male who presents today for follow up.  1) Essential Hypertension: Currently managed on HCTZ 25 mg which was initiated on 12/24/17. He was previously managed on Losartan 50 mg but this was on national back order. He has a history of asthma and emphysema so we decided to avoid lisinopril.   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 He's checked it home with readings of 120-140's/80's-100's. His BP two days ago was 133/88.  He denies chest pain, dizziness, shortness of breath.   BP Readings from Last 3 Encounters:  01/23/18 120/84  12/24/17 (!) 142/100  07/11/17 138/84    Review of Systems  Eyes: Negative for visual disturbance.  Respiratory: Negative for shortness of breath.   Cardiovascular: Negative for chest pain.  Neurological: Negative for dizziness and headaches.       Past Medical History:  Diagnosis Date  . Asthma   . Diverticulitis   . GERD (gastroesophageal reflux disease)   . Hyperlipidemia   . Hypertension   . Migraines   . Smoker 06/27/2012     Social History   Socioeconomic History  . Marital status: Married    Spouse name: Not on file  . Number of children: Not on file  . Years of education: Not on file  . Highest education level: Not on file  Occupational History  . Not on file  Social Needs  . Financial resource strain: Not on file  . Food insecurity:    Worry: Not on file    Inability: Not on file  . Transportation needs:    Medical: Not on file    Non-medical: Not on file  Tobacco Use  . Smoking status: Light Tobacco Smoker    Packs/day: 0.50    Years: 30.00    Pack years: 15.00    Types: Cigarettes  . Smokeless tobacco: Never Used  Substance and Sexual Activity  . Alcohol use: No  . Drug use: No  . Sexual activity: Not on file  Lifestyle  . Physical  activity:    Days per week: Not on file    Minutes per session: Not on file  . Stress: Not on file  Relationships  . Social connections:    Talks on phone: Not on file    Gets together: Not on file    Attends religious service: Not on file    Active member of club or organization: Not on file    Attends meetings of clubs or organizations: Not on file    Relationship status: Not on file  . Intimate partner violence:    Fear of current or ex partner: Not on file    Emotionally abused: Not on file    Physically abused: Not on file    Forced sexual activity: Not on file  Other Topics Concern  . Not on file  Social History Narrative   Married.   3 children.   Works in [employer] se[employer].   Enjoys spending time outdoors.    Past Surgical History:  Procedure Laterality Date  . DG GALL BLADDER  1981    Family History  Problem Relation Age of Onset  . Arthritis Mother   . Hearing loss Mother   . Heart disease Mother   . Hypertension Father   .  Alcohol abuse Father   . Cancer Father        bladder/kidney  . Hearing loss Father   . Hyperlipidemia Father     Allergies  Allergen Reactions  . Lipitor [Atorvastatin] Other (See Comments)    Myalgias, weakness (20 mg)    Current Outpatient Medications on File Prior to Visit  Medication Sig Dispense Refill  . albuterol (PROVENTIL HFA;VENTOLIN HFA) 108 (90 Base) MCG/ACT inhaler Inhale 2 puffs into the lungs every 6 (six) hours as needed for wheezing or shortness of breath. 1 Inhaler 0  . hydrochlorothiazide (HYDRODIURIL) 25 MG tablet Take 1 tablet (25 mg total) by mouth daily. For blood pressure. 30 tablet 0   No current facility-administered medications on file prior to visit.     BP 120/84 (BP Location: Left Arm, Patient Position: Sitting, Cuff Size: Large)   Pulse 79   Temp 97.9 F (36.6 C) (Oral)   Ht 5\' 11"  (1.803 m)   Wt 218 lb (98.9 kg)   SpO2 96%   BMI 30.40 kg/m    Objective:   Physical Exam  Constitutional: He  appears well-nourished.  Neck: Neck supple.  Cardiovascular: Normal rate and regular rhythm.  Respiratory: Effort normal and breath sounds normal.  Skin: Skin is warm and dry.           Assessment & Plan:

## 2018-03-24 ENCOUNTER — Encounter: Payer: Self-pay | Admitting: Primary Care

## 2018-03-24 ENCOUNTER — Ambulatory Visit: Payer: Managed Care, Other (non HMO) | Admitting: Primary Care

## 2018-03-24 VITALS — BP 120/84 | HR 59 | Temp 97.7°F | Ht 71.0 in | Wt 222.0 lb

## 2018-03-24 DIAGNOSIS — I10 Essential (primary) hypertension: Secondary | ICD-10-CM | POA: Diagnosis not present

## 2018-03-24 DIAGNOSIS — Z23 Encounter for immunization: Secondary | ICD-10-CM | POA: Diagnosis not present

## 2018-03-24 DIAGNOSIS — Z0000 Encounter for general adult medical examination without abnormal findings: Secondary | ICD-10-CM | POA: Diagnosis not present

## 2018-03-24 DIAGNOSIS — Z1159 Encounter for screening for other viral diseases: Secondary | ICD-10-CM

## 2018-03-24 DIAGNOSIS — Z125 Encounter for screening for malignant neoplasm of prostate: Secondary | ICD-10-CM | POA: Diagnosis not present

## 2018-03-24 DIAGNOSIS — J439 Emphysema, unspecified: Secondary | ICD-10-CM

## 2018-03-24 DIAGNOSIS — Z0001 Encounter for general adult medical examination with abnormal findings: Secondary | ICD-10-CM | POA: Insufficient documentation

## 2018-03-24 NOTE — Progress Notes (Signed)
Subjective:    Patient ID: ERIBERTO SEDENO, male    DOB: [birth year], 53 y.o.   MRN: [id number]  HPI  Mr. Lapuma is a 53 year old male who presents today for complete physical.  Immunizations: -Tetanus: Due -Influenza: Due today  Diet: He endorses a poor diet Breakfast: Eggs, potatoes, toast with jelly, bacon Lunch: Fast food, restaurants  Dinner: Restaurants, meat, vegetable, starch Snacks: Chips and salsa Desserts: 3-4 times weekly  Beverages: Mostly soda, little water, sweet tea, wine on occasion   Exercise: He is not exercising Eye exam: Completed in 2019 Dental exam: No recent exam  Colonoscopy: Completed within 10 years PSA: Due today Hep C Screen: Due today     Review of Systems  Constitutional: Negative for unexpected weight change.  HENT: Negative for rhinorrhea.   Respiratory: Negative for cough and shortness of breath.   Cardiovascular: Negative for chest pain.  Gastrointestinal: Negative for constipation and diarrhea.  Genitourinary: Negative for difficulty urinating.  Musculoskeletal: Negative for arthralgias.  Skin: Negative for rash.  Allergic/Immunologic: Negative for environmental allergies.  Neurological: Negative for dizziness and numbness.       Intermittent headaches   Psychiatric/Behavioral: The patient is not nervous/anxious.        Past Medical History:  Diagnosis Date  . Asthma   . Diverticulitis   . GERD (gastroesophageal reflux disease)   . Hyperlipidemia   . Hypertension   . Migraines   . Smoker 06/27/2012     Social History   Socioeconomic History  . Marital status: Married    Spouse name: Not on file  . Number of children: Not on file  . Years of education: Not on file  . Highest education level: Not on file  Occupational History  . Not on file  Social Needs  . Financial resource strain: Not on file  . Food insecurity:    Worry: Not on file    Inability: Not on file  . Transportation needs:    Medical: Not on file    Non-medical: Not on file  Tobacco Use  . Smoking status: Light Tobacco Smoker    Packs/day: 0.50    Years: 30.00    Pack years: 15.00    Types: Cigarettes  . Smokeless tobacco: Never Used  Substance and Sexual Activity  . Alcohol use: No  . Drug use: No  . Sexual activity: Not on file  Lifestyle  . Physical activity:    Days per week: Not on file    Minutes per session: Not on file  . Stress: Not on file  Relationships  . Social connections:    Talks on phone: Not on file    Gets together: Not on file    Attends religious service: Not on file    Active member of club or organization: Not on file    Attends meetings of clubs or organizations: Not on file    Relationship status: Not on file  . Intimate partner violence:    Fear of current or ex partner: Not on file    Emotionally abused: Not on file    Physically abused: Not on file    Forced sexual activity: Not on file  Other Topics Concern  . Not on file  Social History Narrative   Married.   3 children.   Works in [employer] se[employer].   Enjoys spending time outdoors.    Past Surgical History:  Procedure Laterality Date  . DG GALL BLADDER  1981  Family History  Problem Relation Age of Onset  . Arthritis Mother   . Hearing loss Mother   . Heart disease Mother   . Hypertension Father   . Alcohol abuse Father   . Cancer Father        bladder/kidney  . Hearing loss Father   . Hyperlipidemia Father     Allergies  Allergen Reactions  . Lipitor [Atorvastatin] Other (See Comments)    Myalgias, weakness (20 mg)    Current Outpatient Medications on File Prior to Visit  Medication Sig Dispense Refill  . albuterol (PROVENTIL HFA;VENTOLIN HFA) 108 (90 Base) MCG/ACT inhaler Inhale 2 puffs into the lungs every 6 (six) hours as needed for wheezing or shortness of breath. 1 Inhaler 0  . hydrochlorothiazide (HYDRODIURIL) 25 MG tablet Take 1 tablet (25 mg total) by mouth daily. For blood pressure. 90 tablet 3   No current  facility-administered medications on file prior to visit.     BP 120/84   Pulse (!) 59   Temp 97.7 F (36.5 C) (Oral)   Ht 5\' 11"  (1.803 m)   Wt 222 lb (100.7 kg)   SpO2 98%   BMI 30.96 kg/m    Objective:   Physical Exam  Constitutional: He is oriented to person, place, and time. He appears well-nourished.  HENT:  Mouth/Throat: No oropharyngeal exudate.  Eyes: Pupils are equal, round, and reactive to light. EOM are normal.  Neck: Neck supple. No thyromegaly present.  Cardiovascular: Normal rate and regular rhythm.  Respiratory: Effort normal. He has wheezes.  Mild wheezing throughout   GI: Soft. Bowel sounds are normal. There is no abdominal tenderness.  Musculoskeletal: Normal range of motion.  Neurological: He is alert and oriented to person, place, and time.  Skin: Skin is warm and dry.  Psychiatric: He has a normal mood and affect.           Assessment & Plan:

## 2018-03-24 NOTE — Assessment & Plan Note (Signed)
Tetanus and influenza vaccinations due, provided today. PSA pending. Colonoscopy UTD. Recommended regular exercise, healthy diet. Exam stable. Labs pending. Follow up in 1 year for CPE.

## 2018-03-24 NOTE — Patient Instructions (Signed)
Stop by the lab prior to leaving today. I will notify you of your results once received.   Start exercising. You should be getting 150 minutes of moderate intensity exercise weekly.  It's important to improve your diet by reducing consumption of fast food, fried food, processed snack foods, sugary drinks. Increase consumption of fresh vegetables and fruits, whole grains, water.  Ensure you are drinking 64 ounces of water daily.  We will see you in 1 year or sooner if needed.  It was a pleasure to see you today!   Preventive Care 40-64 Years, Male Preventive care refers to lifestyle choices and visits with your health care provider that can promote health and wellness. What does preventive care include?   A yearly physical exam. This is also called an annual well check.  Dental exams once or twice a year.  Routine eye exams. Ask your health care provider how often you should have your eyes checked.  Personal lifestyle choices, including: ? Daily care of your teeth and gums. ? Regular physical activity. ? Eating a healthy diet. ? Avoiding tobacco and drug use. ? Limiting alcohol use. ? Practicing safe sex. ? Taking low-dose aspirin every day starting at age 4. What happens during an annual well check? The services and screenings done by your health care provider during your annual well check will depend on your age, overall health, lifestyle risk factors, and family history of disease. Counseling Your health care provider may ask you questions about your:  Alcohol use.  Tobacco use.  Drug use.  Emotional well-being.  Home and relationship well-being.  Sexual activity.  Eating habits.  Work and work Statistician. Screening You may have the following tests or measurements:  Height, weight, and BMI.  Blood pressure.  Lipid and cholesterol levels. These may be checked every 5 years, or more frequently if you are over 71 years old.  Skin check.  Lung cancer  screening. You may have this screening every year starting at age 30 if you have a 30-pack-year history of smoking and currently smoke or have quit within the past 15 years.  Colorectal cancer screening. All adults should have this screening starting at age 42 and continuing until age 80. Your health care provider may recommend screening at age 75. You will have tests every 1-10 years, depending on your results and the type of screening test. People at increased risk should start screening at an earlier age. Screening tests may include: ? Guaiac-based fecal occult blood testing. ? Fecal immunochemical test (FIT). ? Stool DNA test. ? Virtual colonoscopy. ? Sigmoidoscopy. During this test, a flexible tube with a tiny camera (sigmoidoscope) is used to examine your rectum and lower colon. The sigmoidoscope is inserted through your anus into your rectum and lower colon. ? Colonoscopy. During this test, a long, thin, flexible tube with a tiny camera (colonoscope) is used to examine your entire colon and rectum.  Prostate cancer screening. Recommendations will vary depending on your family history and other risks.  Hepatitis C blood test.  Hepatitis B blood test.  Sexually transmitted disease (STD) testing.  Diabetes screening. This is done by checking your blood sugar (glucose) after you have not eaten for a while (fasting). You may have this done every 1-3 years. Discuss your test results, treatment options, and if necessary, the need for more tests with your health care provider. Vaccines Your health care provider may recommend certain vaccines, such as:  Influenza vaccine. This is recommended every year.  Tetanus,  diphtheria, and acellular pertussis (Tdap, Td) vaccine. You may need a Td booster every 10 years.  Varicella vaccine. You may need this if you have not been vaccinated.  Zoster vaccine. You may need this after age 21.  Measles, mumps, and rubella (MMR) vaccine. You may need at  least one dose of MMR if you were born in 1957 or later. You may also need a second dose.  Pneumococcal 13-valent conjugate (PCV13) vaccine. You may need this if you have certain conditions and have not been vaccinated.  Pneumococcal polysaccharide (PPSV23) vaccine. You may need one or two doses if you smoke cigarettes or if you have certain conditions.  Meningococcal vaccine. You may need this if you have certain conditions.  Hepatitis A vaccine. You may need this if you have certain conditions or if you travel or work in places where you may be exposed to hepatitis A.  Hepatitis B vaccine. You may need this if you have certain conditions or if you travel or work in places where you may be exposed to hepatitis B.  Haemophilus influenzae type b (Hib) vaccine. You may need this if you have certain risk factors. Talk to your health care provider about which screenings and vaccines you need and how often you need them. This information is not intended to replace advice given to you by your health care provider. Make sure you discuss any questions you have with your health care provider. Document Released: 01/28/2015 Document Revised: 02/21/2017 Document Reviewed: 11/02/2014 Elsevier Interactive Patient Education  2019 [employer].

## 2018-03-24 NOTE — Assessment & Plan Note (Signed)
Wheezing noted throughout without dyspnea, no recent use of albuterol. Continue to monitor.

## 2018-03-24 NOTE — Assessment & Plan Note (Signed)
Stable in the office today, continue HCTZ 25 mg. BMP reviewed.

## 2018-03-24 NOTE — Addendum Note (Signed)
Addended by: Alvina Chou on: 03/24/2018 09:48 AM   Modules accepted: Orders

## 2018-03-24 NOTE — Addendum Note (Signed)
Addended by: Tawnya Crook on: 03/24/2018 11:00 AM   Modules accepted: Orders

## 2018-03-25 LAB — LIPID PANEL
CHOLESTEROL TOTAL: 221 mg/dL — AB (ref 100–199)
Chol/HDL Ratio: 5.7 ratio — ABNORMAL HIGH (ref 0.0–5.0)
HDL: 39 mg/dL — AB (ref >39)
LDL Calculated: 156 mg/dL — ABNORMAL HIGH (ref 0–99)
Triglycerides: 132 mg/dL (ref 0–149)
VLDL Cholesterol Cal: 26 mg/dL (ref 5–40)

## 2018-03-25 LAB — BASIC METABOLIC PANEL
BUN / CREAT RATIO: 13 (ref 9–20)
BUN: 14 mg/dL (ref 6–24)
CHLORIDE: 100 mmol/L (ref 96–106)
CO2: 24 mmol/L (ref 20–29)
CREATININE: 1.1 mg/dL (ref 0.76–1.27)
Calcium: 9.2 mg/dL (ref 8.7–10.2)
GFR calc Af Amer: 87 mL/min/{1.73_m2} (ref >59)
GFR calc non Af Amer: 75 mL/min/{1.73_m2} (ref >59)
Glucose: 91 mg/dL (ref 65–99)
POTASSIUM: 4.6 mmol/L (ref 3.5–5.2)
SODIUM: 139 mmol/L (ref 134–144)

## 2018-03-25 LAB — PSA: PROSTATE SPECIFIC AG, SERUM: 2.2 ng/mL (ref 0.0–4.0)

## 2018-03-25 LAB — HEPATITIS C ANTIBODY

## 2018-04-01 ENCOUNTER — Other Ambulatory Visit: Payer: Self-pay | Admitting: Primary Care

## 2018-04-01 DIAGNOSIS — I10 Essential (primary) hypertension: Secondary | ICD-10-CM

## 2018-04-01 MED ORDER — HYDROCHLOROTHIAZIDE 25 MG PO TABS: 25.0000 mg | ORAL_TABLET | Freq: Every day | ORAL | 1 refills | Status: DC

## 2018-06-02 ENCOUNTER — Other Ambulatory Visit (INDEPENDENT_AMBULATORY_CARE_PROVIDER_SITE_OTHER): Payer: Managed Care, Other (non HMO)

## 2018-06-02 ENCOUNTER — Ambulatory Visit (INDEPENDENT_AMBULATORY_CARE_PROVIDER_SITE_OTHER): Payer: Managed Care, Other (non HMO) | Admitting: Primary Care

## 2018-06-02 ENCOUNTER — Other Ambulatory Visit: Payer: Self-pay

## 2018-06-02 ENCOUNTER — Encounter: Payer: Self-pay | Admitting: Primary Care

## 2018-06-02 DIAGNOSIS — R1030 Lower abdominal pain, unspecified: Secondary | ICD-10-CM

## 2018-06-02 DIAGNOSIS — K5792 Diverticulitis of intestine, part unspecified, without perforation or abscess without bleeding: Secondary | ICD-10-CM | POA: Diagnosis not present

## 2018-06-02 MED ORDER — METRONIDAZOLE 500 MG PO TABS: 500.0000 mg | ORAL_TABLET | Freq: Three times a day (TID) | ORAL | 0 refills | Status: DC

## 2018-06-02 MED ORDER — CIPROFLOXACIN HCL 500 MG PO TABS: 500.0000 mg | ORAL_TABLET | Freq: Two times a day (BID) | ORAL | 0 refills | Status: DC

## 2018-06-02 NOTE — Patient Instructions (Signed)
Start ciprofloxacin antibiotics for the infection. Take 1 tablet by mouth twice daily for 10 days.  Start metronidazole antibiotics. Take 1 tablet by mouth three times daily for 10 days.  Please call the main office line to schedule a lab only appointment for today.  Please update me if no improvement in 3-4 days.  It was a pleasure to see you today! Mayra Reel, NP-C

## 2018-06-02 NOTE — Assessment & Plan Note (Signed)
Symptoms suggestive of diverticulitis. Exam is limited given virtual visit due to Covid-19. He does appear to be uncomfortable, his diverticulosis is located to the sigmoid colon which is the location of his pain. Given this we will treat. Rx for Cipro and Flagyl courses prescribed. He has taken these in the past and done well.  Also check labs including CBC and CMP. Continue clear liquid diet. If no improvement then consider ED evaluation vs CT abdomen/pelvis. He will update.

## 2018-06-02 NOTE — Progress Notes (Signed)
Subjective:    Patient ID: Noah Wright, male    DOB: 1965-03-09, 53 y.o.   MRN: [id number]  HPI  Virtual Visit via Video Note  I connected with Noah Wright on 06/02/18 at  8:40 AM EDT by a video enabled telemedicine application and verified that I am speaking with the correct person using two identifiers.  Location: Patient: Home Provider: Office   I discussed the limitations of evaluation and management by telemedicine and the availability of in person appointments. The patient expressed understanding and agreed to proceed.  History of Present Illness:  Mr. Noah Wright is a 53 year old male with a history of hypertension, cholecystectomy,  diverticulitis, emphysema who presents today with a chief complaint of abdominal pain.   His pain is located to suprapubic region distal to the naval. His pain began 2-3 days ago. He also reports urgency to have a bowel movement with small amounts of diarrhea each time. He's not checked his temperature but has felt "feverish" with night sweats. Over the last two days he's eased up his diet by trying to incorporate more liquids. When he's eaten solid foods he's noticed a return in pain. Yesterday he had to lay down for most of the day due to his pain. This pain feels very similar to his prior diverticulitis pain. He's taken Tylenol over the last several days with some improvement in symptoms.    Observations/Objective:  Alert and oriented. Appears well, not sickly. No distress. Speaking in complete sentences.   Assessment and Plan:  Symptoms suggestive of diverticulitis. Exam is limited given virtual visit due to Covid-19. He does appear to be uncomfortable, his diverticulosis is located to the sigmoid colon which is the location of his pain. Given this we will treat. Rx for Cipro and Flagyl courses prescribed. He has taken these in the past and done well.  Also check labs including CBC and CMP. Continue clear liquid diet. If no improvement  then consider ED evaluation vs CT abdomen/pelvis. He will update.  Follow Up Instructions:  Start ciprofloxacin antibiotics for the infection. Take 1 tablet by mouth twice daily for 10 days.  Start metronidazole antibiotics. Take 1 tablet by mouth three times daily for 10 days.  Please call the main office line to schedule a lab only appointment for today.  Please update me if no improvement in 3-4 days.  It was a pleasure to see you today! Mayra ReelKate Dhanvi Boesen, NP-C    I discussed the assessment and treatment plan with the patient. The patient was provided an opportunity to ask questions and all were answered. The patient agreed with the plan and demonstrated an understanding of the instructions.   The patient was advised to call back or seek an in-person evaluation if the symptoms worsen or if the condition fails to improve as anticipated.    Doreene NestKatherine K Larene Ascencio, NP    Review of Systems  Constitutional: Positive for chills and fatigue.  Respiratory: Negative for shortness of breath.   Cardiovascular: Negative for chest pain.  Gastrointestinal: Positive for abdominal pain, diarrhea and nausea. Negative for blood in stool and vomiting.       Past Medical History:  Diagnosis Date  . Asthma   . Diverticulitis   . GERD (gastroesophageal reflux disease)   . Hyperlipidemia   . Hypertension   . Migraines   . Smoker 06/27/2012     Social History   Socioeconomic History  . Marital status: Married    Spouse name: Not  on file  . Number of children: Not on file  . Years of education: Not on file  . Highest education level: Not on file  Occupational History  . Not on file  Social Needs  . Financial resource strain: Not on file  . Food insecurity:    Worry: Not on file    Inability: Not on file  . Transportation needs:    Medical: Not on file    Non-medical: Not on file  Tobacco Use  . Smoking status: Light Tobacco Smoker    Packs/day: 0.50    Years: 30.00    Pack years:  15.00    Types: Cigarettes  . Smokeless tobacco: Never Used  Substance and Sexual Activity  . Alcohol use: No  . Drug use: No  . Sexual activity: Not on file  Lifestyle  . Physical activity:    Days per week: Not on file    Minutes per session: Not on file  . Stress: Not on file  Relationships  . Social connections:    Talks on phone: Not on file    Gets together: Not on file    Attends religious service: Not on file    Active member of club or organization: Not on file    Attends meetings of clubs or organizations: Not on file    Relationship status: Not on file  . Intimate partner violence:    Fear of current or ex partner: Not on file    Emotionally abused: Not on file    Physically abused: Not on file    Forced sexual activity: Not on file  Other Topics Concern  . Not on file  Social History Narrative   Married.   3 children.   Works in Field seismologist.   Enjoys spending time outdoors.    Past Surgical History:  Procedure Laterality Date  . DG GALL BLADDER  1981    Family History  Problem Relation Age of Onset  . Arthritis Mother   . Hearing loss Mother   . Heart disease Mother   . Hypertension Father   . Alcohol abuse Father   . Cancer Father        bladder/kidney  . Hearing loss Father   . Hyperlipidemia Father     Allergies  Allergen Reactions  . Lipitor [Atorvastatin] Other (See Comments)    Myalgias, weakness (20 mg)    Current Outpatient Medications on File Prior to Visit  Medication Sig Dispense Refill  . albuterol (PROVENTIL HFA;VENTOLIN HFA) 108 (90 Base) MCG/ACT inhaler Inhale 2 puffs into the lungs every 6 (six) hours as needed for wheezing or shortness of breath. 1 Inhaler 0  . hydrochlorothiazide (HYDRODIURIL) 25 MG tablet Take 1 tablet (25 mg total) by mouth daily. For blood pressure. 90 tablet 1   No current facility-administered medications on file prior to visit.     There were no vitals taken for this visit.   Objective:   Physical  Exam  Constitutional: He is oriented to person, place, and time. He appears well-nourished. He does not appear ill.  Respiratory: Effort normal.  Neurological: He is alert and oriented to person, place, and time.  Psychiatric: He has a normal mood and affect.           Assessment & Plan:

## 2018-06-02 NOTE — Addendum Note (Signed)
Addended by: Alvina Chou on: 06/02/2018 10:30 AM   Modules accepted: Orders

## 2018-06-03 LAB — CBC WITH DIFFERENTIAL/PLATELET
Basophils Absolute: 0.1 10*3/uL (ref 0.0–0.2)
Basos: 1 %
EOS (ABSOLUTE): 0.3 10*3/uL (ref 0.0–0.4)
Eos: 2 %
Hematocrit: 41 % (ref 37.5–51.0)
Hemoglobin: 13.7 g/dL (ref 13.0–17.7)
Immature Grans (Abs): 0 10*3/uL (ref 0.0–0.1)
Immature Granulocytes: 0 %
Lymphocytes Absolute: 2 10*3/uL (ref 0.7–3.1)
Lymphs: 13 %
MCH: 26.1 pg — ABNORMAL LOW (ref 26.6–33.0)
MCHC: 33.4 g/dL (ref 31.5–35.7)
MCV: 78 fL — ABNORMAL LOW (ref 79–97)
Monocytes Absolute: 1.6 10*3/uL — ABNORMAL HIGH (ref 0.1–0.9)
Monocytes: 10 %
Neutrophils Absolute: 11.5 10*3/uL — ABNORMAL HIGH (ref 1.4–7.0)
Neutrophils: 74 %
Platelets: 301 10*3/uL (ref 150–450)
RBC: 5.25 x10E6/uL (ref 4.14–5.80)
RDW: 14.9 % (ref 11.6–15.4)
WBC: 15.5 10*3/uL — ABNORMAL HIGH (ref 3.4–10.8)

## 2018-06-03 LAB — COMPREHENSIVE METABOLIC PANEL
ALT: 15 IU/L (ref 0–44)
AST: 17 IU/L (ref 0–40)
Albumin/Globulin Ratio: 1.2 (ref 1.2–2.2)
Albumin: 4.1 g/dL (ref 3.8–4.9)
Alkaline Phosphatase: 62 IU/L (ref 39–117)
BUN/Creatinine Ratio: 11 (ref 9–20)
BUN: 12 mg/dL (ref 6–24)
Bilirubin Total: 0.5 mg/dL (ref 0.0–1.2)
CO2: 25 mmol/L (ref 20–29)
Calcium: 9.4 mg/dL (ref 8.7–10.2)
Chloride: 97 mmol/L (ref 96–106)
Creatinine, Ser: 1.1 mg/dL (ref 0.76–1.27)
GFR calc Af Amer: 87 mL/min/{1.73_m2} (ref >59)
GFR calc non Af Amer: 75 mL/min/{1.73_m2} (ref >59)
Globulin, Total: 3.4 g/dL (ref 1.5–4.5)
Glucose: 109 mg/dL — ABNORMAL HIGH (ref 65–99)
Potassium: 3.8 mmol/L (ref 3.5–5.2)
Sodium: 137 mmol/L (ref 134–144)
Total Protein: 7.5 g/dL (ref 6.0–8.5)

## 2018-07-29 ENCOUNTER — Other Ambulatory Visit: Payer: Self-pay | Admitting: Primary Care

## 2018-07-29 DIAGNOSIS — I10 Essential (primary) hypertension: Secondary | ICD-10-CM

## 2018-08-28 ENCOUNTER — Other Ambulatory Visit: Payer: Self-pay | Admitting: Primary Care

## 2018-08-28 DIAGNOSIS — J439 Emphysema, unspecified: Secondary | ICD-10-CM

## 2018-09-16 ENCOUNTER — Other Ambulatory Visit: Payer: Self-pay | Admitting: Primary Care

## 2018-09-16 DIAGNOSIS — J439 Emphysema, unspecified: Secondary | ICD-10-CM

## 2018-10-01 ENCOUNTER — Ambulatory Visit: Payer: Managed Care, Other (non HMO) | Admitting: Primary Care

## 2018-10-01 ENCOUNTER — Encounter: Payer: Self-pay | Admitting: Primary Care

## 2018-10-01 ENCOUNTER — Other Ambulatory Visit: Payer: Self-pay

## 2018-10-01 VITALS — BP 136/84 | HR 60 | Temp 97.7°F | Ht 71.0 in | Wt 216.8 lb

## 2018-10-01 DIAGNOSIS — B029 Zoster without complications: Secondary | ICD-10-CM

## 2018-10-01 DIAGNOSIS — Z23 Encounter for immunization: Secondary | ICD-10-CM

## 2018-10-01 HISTORY — DX: Zoster without complications: B02.9

## 2018-10-01 MED ORDER — VALACYCLOVIR HCL 1 G PO TABS: 1000.0000 mg | ORAL_TABLET | Freq: Three times a day (TID) | ORAL | 0 refills | Status: DC

## 2018-10-01 NOTE — Patient Instructions (Signed)
Start valacyclovir for likely shingles. Take 1 tablet by mouth three times daily for 7 days.  Please notify me immediately if you experience any problems with your vision or notice a rash near or on your eye.  Please update me in regards to your symptoms, especially if no improvement within 3-4 days.  It was a pleasure to see you today!

## 2018-10-01 NOTE — Progress Notes (Signed)
Subjective:    Patient ID: Noah Wright, male    DOB: [birth year], 53 y.o.   MRN: [id number]  HPI  Noah Wright is a 53 year old male with a history of hypertension, migraines, herpes zoster, emphysema, tobacco abuse who presents today with a chief complaint of headache.  His headache is located to the entire right parietal lobe with radiation down to the right ear. This began about 9 days ago which is constant dull pain with intermittent sharp pain that travels. He's also noticed scalp tenderness with slight palpation. He's also noticed right ear sensitivity and right sore throat with swallowing.   Five days ago he began to experience a moderate headache with nausea and vomiting once. Also with photophobia. He took Tylenol and BC Powder at the time without improvement. Currently denies photophobia and nausea, visual changes, dizziness, rash.   BP Readings from Last 3 Encounters:  10/01/18 136/84  03/24/18 120/84  01/23/18 120/84     Review of Systems  Constitutional: Negative for fever.  Eyes: Negative for visual disturbance.  Respiratory: Negative for cough and shortness of breath.   Skin: Negative for rash.  Neurological: Positive for headaches. Negative for dizziness.       Past Medical History:  Diagnosis Date  . Asthma   . Diverticulitis   . GERD (gastroesophageal reflux disease)   . Hyperlipidemia   . Hypertension   . Migraines   . Shingles   . Smoker 06/27/2012     Social History   Socioeconomic History  . Marital status: Married    Spouse name: Not on file  . Number of children: Not on file  . Years of education: Not on file  . Highest education level: Not on file  Occupational History  . Not on file  Social Needs  . Financial resource strain: Not on file  . Food insecurity    Worry: Not on file    Inability: Not on file  . Transportation needs    Medical: Not on file    Non-medical: Not on file  Tobacco Use  . Smoking status: Light Tobacco Smoker    Packs/day: 0.50    Years: 30.00    Pack years: 15.00    Types: Cigarettes  . Smokeless tobacco: Never Used  Substance and Sexual Activity  . Alcohol use: No  . Drug use: No  . Sexual activity: Not on file  Lifestyle  . Physical activity    Days per week: Not on file    Minutes per session: Not on file  . Stress: Not on file  Relationships  . Social Herbalist on phone: Not on file    Gets together: Not on file    Attends religious service: Not on file    Active member of club or organization: Not on file    Attends meetings of clubs or organizations: Not on file    Relationship status: Not on file  . Intimate partner violence    Fear of current or ex partner: Not on file    Emotionally abused: Not on file    Physically abused: Not on file    Forced sexual activity: Not on file  Other Topics Concern  . Not on file  Social History Narrative   Married.   3 children.   Works in Engineer, materials.   Enjoys spending time outdoors.    Past Surgical History:  Procedure Laterality Date  . [place]  Family History  Problem Relation Age of Onset  . Arthritis Mother   . Hearing loss Mother   . Heart disease Mother   . Hypertension Father   . Alcohol abuse Father   . Cancer Father        bladder/kidney  . Hearing loss Father   . Hyperlipidemia Father     Allergies  Allergen Reactions  . Lipitor [Atorvastatin] Other (See Comments)    Myalgias, weakness (20 mg)    Current Outpatient Medications on File Prior to Visit  Medication Sig Dispense Refill  . hydrochlorothiazide (HYDRODIURIL) 25 MG tablet TAKE 1 TABLET BY MOUTH  DAILY FOR BLOOD PRESSURE 90 tablet 1  . VENTOLIN HFA 108 (90 Base) MCG/ACT inhaler INHALE 2 PUFFS INTO THE LUNGS EVERY 6 HOURS AS NEEDED FOR WHEEZE OR SHORTNESS OF BREATH (Patient not taking: Reported on 10/01/2018) 18 g 0   No current facility-administered medications on file prior to visit.     BP 136/84   Pulse 60   Temp 97.7 F  (36.5 C) (Temporal)   Ht 5\' 11"  (1.803 m)   Wt 216 lb 12 oz (98.3 kg)   SpO2 98%   BMI 30.23 kg/m    Objective:   Physical Exam  Constitutional: He appears well-nourished.  HENT:  Right Ear: Tympanic membrane and ear canal normal.  Left Ear: Tympanic membrane and ear canal normal.  Mouth/Throat: Oropharynx is clear and moist. No oropharyngeal exudate or posterior oropharyngeal edema.  Neck: Neck supple.  Cardiovascular: Normal rate.  Respiratory: Effort normal.  Neurological:  Scalp tenderness with light palpation to right parietal lobe  Skin: Skin is warm and dry. No rash noted.           Assessment & Plan:

## 2018-10-01 NOTE — Assessment & Plan Note (Addendum)
Symptoms characteristic of herpes zoster, especially given prior history of shingles and pattern of discomfort. He doesn't seem to have an active migraine, perhaps he did five days ago but this has resolved. Doubt trigeminal neuralgia.   Rx for Valtrex course x 7 days sent to pharmacy. Discussed to notify if he experiences any occular involvement as this would be an urgent referral to ophthalmology.   He will update in a few days. He kindly declines IM Toradol today.

## 2018-10-01 NOTE — Addendum Note (Signed)
Addended by: Jacqualin Combes on: 10/01/2018 09:41 AM   Modules accepted: Orders

## 2019-01-11 ENCOUNTER — Other Ambulatory Visit: Payer: Self-pay | Admitting: Primary Care

## 2019-01-11 DIAGNOSIS — I10 Essential (primary) hypertension: Secondary | ICD-10-CM

## 2019-03-27 ENCOUNTER — Ambulatory Visit: Payer: Managed Care, Other (non HMO)

## 2019-04-02 ENCOUNTER — Other Ambulatory Visit: Payer: Self-pay | Admitting: Primary Care

## 2019-04-02 DIAGNOSIS — I10 Essential (primary) hypertension: Secondary | ICD-10-CM

## 2019-06-22 ENCOUNTER — Other Ambulatory Visit: Payer: Self-pay | Admitting: Primary Care

## 2019-06-22 DIAGNOSIS — I10 Essential (primary) hypertension: Secondary | ICD-10-CM

## 2019-07-03 ENCOUNTER — Other Ambulatory Visit: Payer: Self-pay | Admitting: Primary Care

## 2019-07-03 DIAGNOSIS — Z114 Encounter for screening for human immunodeficiency virus [HIV]: Secondary | ICD-10-CM

## 2019-07-03 DIAGNOSIS — I10 Essential (primary) hypertension: Secondary | ICD-10-CM

## 2019-07-17 ENCOUNTER — Other Ambulatory Visit (INDEPENDENT_AMBULATORY_CARE_PROVIDER_SITE_OTHER): Payer: Managed Care, Other (non HMO)

## 2019-07-17 ENCOUNTER — Other Ambulatory Visit: Payer: Self-pay

## 2019-07-17 DIAGNOSIS — I10 Essential (primary) hypertension: Secondary | ICD-10-CM | POA: Diagnosis not present

## 2019-07-17 DIAGNOSIS — Z114 Encounter for screening for human immunodeficiency virus [HIV]: Secondary | ICD-10-CM

## 2019-07-18 LAB — COMPREHENSIVE METABOLIC PANEL
ALT: 23 IU/L (ref 0–44)
AST: 21 IU/L (ref 0–40)
Albumin/Globulin Ratio: 1.6 (ref 1.2–2.2)
Albumin: 4.2 g/dL (ref 3.8–4.9)
Alkaline Phosphatase: 54 IU/L (ref 48–121)
BUN/Creatinine Ratio: 11 (ref 9–20)
BUN: 13 mg/dL (ref 6–24)
Bilirubin Total: 0.5 mg/dL (ref 0.0–1.2)
CO2: 27 mmol/L (ref 20–29)
Calcium: 9.6 mg/dL (ref 8.7–10.2)
Chloride: 102 mmol/L (ref 96–106)
Creatinine, Ser: 1.15 mg/dL (ref 0.76–1.27)
GFR calc Af Amer: 82 mL/min/{1.73_m2} (ref >59)
GFR calc non Af Amer: 71 mL/min/{1.73_m2} (ref >59)
Globulin, Total: 2.7 g/dL (ref 1.5–4.5)
Glucose: 94 mg/dL (ref 65–99)
Potassium: 4.5 mmol/L (ref 3.5–5.2)
Sodium: 141 mmol/L (ref 134–144)
Total Protein: 6.9 g/dL (ref 6.0–8.5)

## 2019-07-18 LAB — CBC
Hematocrit: 47.3 % (ref 37.5–51.0)
Hemoglobin: 15.5 g/dL (ref 13.0–17.7)
MCH: 27.1 pg (ref 26.6–33.0)
MCHC: 32.8 g/dL (ref 31.5–35.7)
MCV: 83 fL (ref 79–97)
Platelets: 279 10*3/uL (ref 150–450)
RBC: 5.72 x10E6/uL (ref 4.14–5.80)
RDW: 13.6 % (ref 11.6–15.4)
WBC: 6.2 10*3/uL (ref 3.4–10.8)

## 2019-07-18 LAB — LIPID PANEL
Chol/HDL Ratio: 6.7 ratio — ABNORMAL HIGH (ref 0.0–5.0)
Cholesterol, Total: 234 mg/dL — ABNORMAL HIGH (ref 100–199)
HDL: 35 mg/dL — ABNORMAL LOW (ref >39)
LDL Chol Calc (NIH): 174 mg/dL — ABNORMAL HIGH (ref 0–99)
Triglycerides: 137 mg/dL (ref 0–149)
VLDL Cholesterol Cal: 25 mg/dL (ref 5–40)

## 2019-07-18 LAB — HEMOGLOBIN A1C
Est. average glucose Bld gHb Est-mCnc: 120 mg/dL
Hgb A1c MFr Bld: 5.8 % — ABNORMAL HIGH (ref 4.8–5.6)

## 2019-07-18 LAB — HIV ANTIBODY (ROUTINE TESTING W REFLEX): HIV Screen 4th Generation wRfx: NONREACTIVE

## 2019-07-31 ENCOUNTER — Ambulatory Visit (INDEPENDENT_AMBULATORY_CARE_PROVIDER_SITE_OTHER): Payer: Managed Care, Other (non HMO) | Admitting: Primary Care

## 2019-07-31 ENCOUNTER — Encounter: Payer: Self-pay | Admitting: Primary Care

## 2019-07-31 VITALS — BP 118/80 | HR 67 | Temp 96.6°F | Ht 71.0 in | Wt 217.8 lb

## 2019-07-31 DIAGNOSIS — J439 Emphysema, unspecified: Secondary | ICD-10-CM | POA: Diagnosis not present

## 2019-07-31 DIAGNOSIS — Z0000 Encounter for general adult medical examination without abnormal findings: Secondary | ICD-10-CM | POA: Diagnosis not present

## 2019-07-31 DIAGNOSIS — I10 Essential (primary) hypertension: Secondary | ICD-10-CM

## 2019-07-31 DIAGNOSIS — E785 Hyperlipidemia, unspecified: Secondary | ICD-10-CM

## 2019-07-31 DIAGNOSIS — R7303 Prediabetes: Secondary | ICD-10-CM

## 2019-07-31 MED ORDER — ROSUVASTATIN CALCIUM 20 MG PO TABS: 20.0000 mg | ORAL_TABLET | Freq: Every evening | ORAL | 3 refills | Status: DC

## 2019-07-31 NOTE — Assessment & Plan Note (Signed)
Immunizations UTD. Last colonoscopy [place] unknown. He will check on records. PSA UTD. Discussed the importance of a healthy diet and regular exercise in order for weight loss, and to reduce the risk of any potential medical problems.  Exam today unremarkable. Labs reviewed.

## 2019-07-31 NOTE — Assessment & Plan Note (Addendum)
LDL of 174 which is an increase from last year with LDL of 158.  ASCVD risk score of 17.5%. Given this, and his history of tobacco abuse, prediabetes, and hypertension, we will treat with rosuvastatin 20 mg.   Repeat lipids and LFT's in 2 months.

## 2019-07-31 NOTE — Assessment & Plan Note (Signed)
Using albuterol nightly, no specific reason. Discussed to stop using daily, only use if needed. Consider ICS if he feels as though he needs albuterol.

## 2019-07-31 NOTE — Assessment & Plan Note (Signed)
A1C of 5.8 on recent labs. Discussed to work on weight loss through diet and exercise.   Continue to monitor.

## 2019-07-31 NOTE — Progress Notes (Signed)
Subjective:    Patient ID: Noah Wright, male    DOB: 01/14/1966, 54 y.o.   MRN: [id number]  HPI  This visit occurred during the SARS-CoV-2 public health emergency.  Safety protocols were in place, including screening questions prior to the visit, additional usage of staff PPE, and extensive cleaning of exam room while observing appropriate contact time as indicated for disinfecting solutions.   Noah Wright is a 54 year old male who presents today for complete physical.  Immunizations: -Tetanus: Completed in 2020 -Influenza: Due this season  -Covid-19: Completed series  Diet: Poor diet. Exercise: No regular exercise  Eye exam: no recent eye exam Dental exam: no recent dental exam  Colonoscopy: Unsure, nothing on file.  PSA: 2.2 in 2020 Hep C Screen: Negative  The 10-year ASCVD risk score Denman George DC Jr., et al., 2013) is: 17.5%   Values used to calculate the score:     Age: 51 years     Sex: Male     Is Non-Hispanic African American: No     Diabetic: No     Tobacco smoker: Yes     Systolic Blood Pressure: 118 mmHg     Is BP treated: Yes     HDL Cholesterol: 35 mg/dL     Total Cholesterol: 234 mg/dL   BP Readings from Last 3 Encounters:  07/31/19 118/80  10/01/18 136/84  03/24/18 120/84     Review of Systems  Constitutional: Negative for unexpected weight change.  HENT: Negative for rhinorrhea.   Respiratory: Negative for cough and shortness of breath.   Cardiovascular: Negative for chest pain.  Gastrointestinal: Negative for constipation and diarrhea.  Genitourinary: Negative for difficulty urinating.  Musculoskeletal: Positive for arthralgias and back pain.  Skin: Negative for rash.  Allergic/Immunologic: Negative for environmental allergies.  Neurological: Negative for dizziness, numbness and headaches.  Psychiatric/Behavioral: The patient is not nervous/anxious.        Past Medical History:  Diagnosis Date  . Asthma   . Diverticulitis   . GERD  (gastroesophageal reflux disease)   . Hyperlipidemia   . Hypertension   . Migraines   . Shingles   . Smoker 06/27/2012     Social History   Socioeconomic History  . Marital status: Married    Spouse name: Not on file  . Number of children: Not on file  . Years of education: Not on file  . Highest education level: Not on file  Occupational History  . Not on file  Tobacco Use  . Smoking status: Light Tobacco Smoker    Packs/day: 0.50    Years: 30.00    Pack years: 15.00    Types: Cigarettes  . Smokeless tobacco: Never Used  Substance and Sexual Activity  . Alcohol use: No  . Drug use: No  . Sexual activity: Not on file  Other Topics Concern  . Not on file  Social History Narrative   Married.   3 children.   Works in Field seismologist.   Enjoys spending time outdoors.   Social Determinants of Health   Financial Resource Strain:   . Difficulty of Paying Living Expenses:   Food Insecurity:   . Worried About Programme researcher, broadcasting/film/video in the Last Year:   . Barista in the Last Year:   Transportation Needs:   . Freight forwarder (Medical):   Marland Kitchen Lack of Transportation (Non-Medical):   Physical Activity:   . Days of Exercise per Week:   . Minutes of  Exercise per Session:   Stress:   . Feeling of Stress :   Social Connections:   . Frequency of Communication with Friends and Family:   . Frequency of Social Gatherings with Friends and Family:   . Attends Religious Services:   . Active Member of Clubs or Organizations:   . Attends Banker Meetings:   Marland Kitchen Marital Status:   Intimate Partner Violence:   . Fear of Current or Ex-Partner:   . Emotionally Abused:   Marland Kitchen Physically Abused:   . Sexually Abused:     Past Surgical History:  Procedure Laterality Date  . DG GALL BLADDER  1981    Family History  Problem Relation Age of Onset  . Arthritis Mother   . Hearing loss Mother   . Heart disease Mother   . Hypertension Father   . Alcohol abuse Father   .  Cancer Father        bladder/kidney  . Hearing loss Father   . Hyperlipidemia Father     Allergies  Allergen Reactions  . Lipitor [Atorvastatin] Other (See Comments)    Myalgias, weakness (20 mg)    Current Outpatient Medications on File Prior to Visit  Medication Sig Dispense Refill  . hydrochlorothiazide (HYDRODIURIL) 25 MG tablet TAKE 1 TABLET BY MOUTH  DAILY FOR BLOOD PRESSURE 90 tablet 0  . VENTOLIN HFA 108 (90 Base) MCG/ACT inhaler INHALE 2 PUFFS INTO THE LUNGS EVERY 6 HOURS AS NEEDED FOR WHEEZE OR SHORTNESS OF BREATH 18 g 0  . valACYclovir (VALTREX) 1000 MG tablet Take 1 tablet (1,000 mg total) by mouth 3 (three) times daily. (Patient not taking: Reported on 07/31/2019) 21 tablet 0   No current facility-administered medications on file prior to visit.    BP 118/80   Pulse 67   Temp (!) 96.6 F (35.9 C) (Temporal)   Ht 5\' 11"  (1.803 m)   Wt 217 lb 12 oz (98.8 kg)   SpO2 98%   BMI 30.37 kg/m    Objective:   Physical Exam HENT:     Right Ear: Tympanic membrane and ear canal normal.     Left Ear: Tympanic membrane and ear canal normal.  Eyes:     Pupils: Pupils are equal, round, and reactive to light.  Cardiovascular:     Rate and Rhythm: Normal rate and regular rhythm.  Pulmonary:     Effort: Pulmonary effort is normal.     Breath sounds: Normal breath sounds.  Abdominal:     General: Bowel sounds are normal.     Palpations: Abdomen is soft.     Tenderness: There is no abdominal tenderness.  Musculoskeletal:        General: Normal range of motion.     Cervical back: Neck supple.  Skin:    General: Skin is warm and dry.  Neurological:     Mental Status: He is alert and oriented to person, place, and time.     Cranial Nerves: No cranial nerve deficit.     Deep Tendon Reflexes:     Reflex Scores:      Patellar reflexes are 2+ on the right side and 2+ on the left side. Psychiatric:        Mood and Affect: Mood normal.            Assessment & Plan:

## 2019-07-31 NOTE — Assessment & Plan Note (Signed)
Well controlled in the office today, continue HCTZ. CMP reviewed.

## 2019-07-31 NOTE — Patient Instructions (Signed)
Start taking rosuvastatin 20 mg once every evening for cholesterol.  Start exercising. You should be getting 150 minutes of moderate intensity exercise weekly.  It's important to improve your diet by reducing consumption of fast food, fried food, processed snack foods, sugary drinks. Increase consumption of fresh vegetables and fruits, whole grains, water.  Ensure you are drinking 64 ounces of water daily.  We do not have your last colonoscopy on file, please try to find out your last colonoscopy date and when you are due again.  Schedule a lab appointment for 2 months to repeat cholesterol.  It was a pleasure to see you today!   Preventive Care 2-49 Years Old, Male Preventive care refers to lifestyle choices and visits with your health care provider that can promote health and wellness. This includes:  A yearly physical exam. This is also called an annual well check.  Regular dental and eye exams.  Immunizations.  Screening for certain conditions.  Healthy lifestyle choices, such as eating a healthy diet, getting regular exercise, not using drugs or products that contain nicotine and tobacco, and limiting alcohol use. What can I expect for my preventive care visit? Physical exam Your health care provider will check:  Height and weight. These may be used to calculate body mass index (BMI), which is a measurement that tells if you are at a healthy weight.  Heart rate and blood pressure.  Your skin for abnormal spots. Counseling Your health care provider may ask you questions about:  Alcohol, tobacco, and drug use.  Emotional well-being.  Home and relationship well-being.  Sexual activity.  Eating habits.  Work and work Statistician. What immunizations do I need?  Influenza (flu) vaccine  This is recommended every year. Tetanus, diphtheria, and pertussis (Tdap) vaccine  You may need a Td booster every 10 years. Varicella (chickenpox) vaccine  You may need  this vaccine if you have not already been vaccinated. Zoster (shingles) vaccine  You may need this after age 62. Measles, mumps, and rubella (MMR) vaccine  You may need at least one dose of MMR if you were born in 1957 or later. You may also need a second dose. Pneumococcal conjugate (PCV13) vaccine  You may need this if you have certain conditions and were not previously vaccinated. Pneumococcal polysaccharide (PPSV23) vaccine  You may need one or two doses if you smoke cigarettes or if you have certain conditions. Meningococcal conjugate (MenACWY) vaccine  You may need this if you have certain conditions. Hepatitis A vaccine  You may need this if you have certain conditions or if you travel or work in places where you may be exposed to hepatitis A. Hepatitis B vaccine  You may need this if you have certain conditions or if you travel or work in places where you may be exposed to hepatitis B. Haemophilus influenzae type b (Hib) vaccine  You may need this if you have certain risk factors. Human papillomavirus (HPV) vaccine  If recommended by your health care provider, you may need three doses over 6 months. You may receive vaccines as individual doses or as more than one vaccine together in one shot (combination vaccines). Talk with your health care provider about the risks and benefits of combination vaccines. What tests do I need? Blood tests  Lipid and cholesterol levels. These may be checked every 5 years, or more frequently if you are over 23 years old.  Hepatitis C test.  Hepatitis B test. Screening  Lung cancer screening. You may have  this screening every year starting at age 66 if you have a 30-pack-year history of smoking and currently smoke or have quit within the past 15 years.  Prostate cancer screening. Recommendations will vary depending on your family history and other risks.  Colorectal cancer screening. All adults should have this screening starting at age  17 and continuing until age 56. Your health care provider may recommend screening at age 90 if you are at increased risk. You will have tests every 1-10 years, depending on your results and the type of screening test.  Diabetes screening. This is done by checking your blood sugar (glucose) after you have not eaten for a while (fasting). You may have this done every 1-3 years.  Sexually transmitted disease (STD) testing. Follow these instructions at home: Eating and drinking  Eat a diet that includes fresh fruits and vegetables, whole grains, lean protein, and low-fat dairy products.  Take vitamin and mineral supplements as recommended by your health care provider.  Do not drink alcohol if your health care provider tells you not to drink.  If you drink alcohol: ? Limit how much you have to 0-2 drinks a day. ? Be aware of how much alcohol is in your drink. In the [place] one drink equals one 12 oz bottle of beer (355 mL), one 5 oz glass of wine (148 mL), or one 1 oz glass of hard liquor (44 mL). Lifestyle  Take daily care of your teeth and gums.  Stay active. Exercise for at least 30 minutes on 5 or more days each week.  Do not use any products that contain nicotine or tobacco, such as cigarettes, e-cigarettes, and chewing tobacco. If you need help quitting, ask your health care provider.  If you are sexually active, practice safe sex. Use a condom or other form of protection to prevent STIs (sexually transmitted infections).  Talk with your health care provider about taking a low-dose aspirin every day starting at age 30. What's next?  Go to your health care provider once a year for a well check visit.  Ask your health care provider how often you should have your eyes and teeth checked.  Stay up to date on all vaccines. This information is not intended to replace advice given to you by your health care provider. Make sure you discuss any questions you have with your health care  provider. Document Revised: 12/26/2017 Document Reviewed: 12/26/2017 Elsevier Patient Education  2020 [employer].

## 2019-08-04 NOTE — Telephone Encounter (Signed)
Please call patient and schedule lab appointment. Need to be 2 months from 07/31/2019 and it is for cholesterol.   Thank you

## 2019-08-05 NOTE — Telephone Encounter (Signed)
Called patient 2x to schedule 2 month follow up with PCP. LVM to call back or message via mychart to schedule.

## 2019-09-15 ENCOUNTER — Other Ambulatory Visit: Payer: Self-pay | Admitting: Primary Care

## 2019-09-15 DIAGNOSIS — I10 Essential (primary) hypertension: Secondary | ICD-10-CM

## 2019-09-18 ENCOUNTER — Other Ambulatory Visit: Payer: Self-pay | Admitting: Primary Care

## 2019-09-18 DIAGNOSIS — E785 Hyperlipidemia, unspecified: Secondary | ICD-10-CM

## 2019-10-02 ENCOUNTER — Other Ambulatory Visit (INDEPENDENT_AMBULATORY_CARE_PROVIDER_SITE_OTHER): Payer: Managed Care, Other (non HMO)

## 2019-10-02 ENCOUNTER — Other Ambulatory Visit: Payer: Self-pay

## 2019-10-02 DIAGNOSIS — E785 Hyperlipidemia, unspecified: Secondary | ICD-10-CM

## 2019-10-05 LAB — LIPID PANEL
Chol/HDL Ratio: 3.6 ratio (ref 0.0–5.0)
Cholesterol, Total: 131 mg/dL (ref 100–199)
HDL: 36 mg/dL — ABNORMAL LOW (ref >39)
LDL Chol Calc (NIH): 77 mg/dL (ref 0–99)
Triglycerides: 92 mg/dL (ref 0–149)
VLDL Cholesterol Cal: 18 mg/dL (ref 5–40)

## 2019-10-05 LAB — HEPATIC FUNCTION PANEL
ALT: 26 IU/L (ref 0–44)
AST: 20 IU/L (ref 0–40)
Albumin: 4.4 g/dL (ref 3.8–4.9)
Alkaline Phosphatase: 57 IU/L (ref 44–121)
Bilirubin Total: 0.6 mg/dL (ref 0.0–1.2)
Bilirubin, Direct: 0.19 mg/dL (ref 0.00–0.40)
Total Protein: 7.1 g/dL (ref 6.0–8.5)

## 2019-12-09 ENCOUNTER — Other Ambulatory Visit: Payer: Self-pay | Admitting: Primary Care

## 2019-12-09 DIAGNOSIS — I10 Essential (primary) hypertension: Secondary | ICD-10-CM

## 2020-11-05 ENCOUNTER — Other Ambulatory Visit: Payer: Self-pay | Admitting: Primary Care

## 2020-11-05 DIAGNOSIS — E785 Hyperlipidemia, unspecified: Secondary | ICD-10-CM

## 2020-11-10 ENCOUNTER — Other Ambulatory Visit: Payer: Self-pay | Admitting: Primary Care

## 2020-11-10 DIAGNOSIS — I10 Essential (primary) hypertension: Secondary | ICD-10-CM

## 2020-11-30 ENCOUNTER — Encounter: Payer: Self-pay | Admitting: Primary Care

## 2020-11-30 ENCOUNTER — Other Ambulatory Visit: Payer: Self-pay

## 2020-11-30 ENCOUNTER — Ambulatory Visit (INDEPENDENT_AMBULATORY_CARE_PROVIDER_SITE_OTHER): Payer: Managed Care, Other (non HMO) | Admitting: Primary Care

## 2020-11-30 VITALS — BP 118/76 | HR 74 | Temp 97.7°F | Ht 71.0 in | Wt 225.0 lb

## 2020-11-30 DIAGNOSIS — Z23 Encounter for immunization: Secondary | ICD-10-CM | POA: Diagnosis not present

## 2020-11-30 DIAGNOSIS — F17200 Nicotine dependence, unspecified, uncomplicated: Secondary | ICD-10-CM

## 2020-11-30 DIAGNOSIS — Z0000 Encounter for general adult medical examination without abnormal findings: Secondary | ICD-10-CM | POA: Diagnosis not present

## 2020-11-30 DIAGNOSIS — Z122 Encounter for screening for malignant neoplasm of respiratory organs: Secondary | ICD-10-CM

## 2020-11-30 DIAGNOSIS — I10 Essential (primary) hypertension: Secondary | ICD-10-CM

## 2020-11-30 DIAGNOSIS — M25561 Pain in right knee: Secondary | ICD-10-CM

## 2020-11-30 DIAGNOSIS — Z1211 Encounter for screening for malignant neoplasm of colon: Secondary | ICD-10-CM

## 2020-11-30 DIAGNOSIS — J439 Emphysema, unspecified: Secondary | ICD-10-CM

## 2020-11-30 DIAGNOSIS — G8929 Other chronic pain: Secondary | ICD-10-CM

## 2020-11-30 DIAGNOSIS — R7303 Prediabetes: Secondary | ICD-10-CM | POA: Diagnosis not present

## 2020-11-30 DIAGNOSIS — E785 Hyperlipidemia, unspecified: Secondary | ICD-10-CM | POA: Diagnosis not present

## 2020-11-30 DIAGNOSIS — Z125 Encounter for screening for malignant neoplasm of prostate: Secondary | ICD-10-CM

## 2020-11-30 DIAGNOSIS — M25562 Pain in left knee: Secondary | ICD-10-CM

## 2020-11-30 LAB — LIPID PANEL
Cholesterol: 195 mg/dL (ref 0–200)
HDL: 42 mg/dL (ref >39.00)
LDL Cholesterol: 126 mg/dL — ABNORMAL HIGH (ref 0–99)
NonHDL: 152.64
Total CHOL/HDL Ratio: 5
Triglycerides: 133 mg/dL (ref 0.0–149.0)
VLDL: 26.6 mg/dL (ref 0.0–40.0)

## 2020-11-30 LAB — CBC
HCT: 42.4 % (ref 39.0–52.0)
Hemoglobin: 13.5 g/dL (ref 13.0–17.0)
MCHC: 31.8 g/dL (ref 30.0–36.0)
MCV: 78.6 fl (ref 78.0–100.0)
Platelets: 253 10*3/uL (ref 150.0–400.0)
RBC: 5.4 Mil/uL (ref 4.22–5.81)
RDW: 18.4 % — ABNORMAL HIGH (ref 11.5–15.5)
WBC: 5.7 10*3/uL (ref 4.0–10.5)

## 2020-11-30 LAB — COMPREHENSIVE METABOLIC PANEL
ALT: 19 U/L (ref 0–53)
AST: 18 U/L (ref 0–37)
Albumin: 4.4 g/dL (ref 3.5–5.2)
Alkaline Phosphatase: 54 U/L (ref 39–117)
BUN: 13 mg/dL (ref 6–23)
CO2: 32 mEq/L (ref 19–32)
Calcium: 9.4 mg/dL (ref 8.4–10.5)
Chloride: 101 mEq/L (ref 96–112)
Creatinine, Ser: 1.11 mg/dL (ref 0.40–1.50)
GFR: 73.56 mL/min (ref >60.00)
Glucose, Bld: 82 mg/dL (ref 70–99)
Potassium: 4.2 mEq/L (ref 3.5–5.1)
Sodium: 139 mEq/L (ref 135–145)
Total Bilirubin: 0.5 mg/dL (ref 0.2–1.2)
Total Protein: 7.1 g/dL (ref 6.0–8.3)

## 2020-11-30 LAB — PSA: PSA: 1.8 ng/mL (ref 0.10–4.00)

## 2020-11-30 LAB — HEMOGLOBIN A1C: Hgb A1c MFr Bld: 6.3 % (ref 4.6–6.5)

## 2020-11-30 MED ORDER — ROSUVASTATIN CALCIUM 20 MG PO TABS: 20.0000 mg | ORAL_TABLET | Freq: Every evening | ORAL | 3 refills | Status: DC

## 2020-11-30 MED ORDER — ALBUTEROL SULFATE HFA 108 (90 BASE) MCG/ACT IN AERS: INHALATION_SPRAY | RESPIRATORY_TRACT | 0 refills | Status: DC

## 2020-11-30 NOTE — Patient Instructions (Signed)
Stop by the lab prior to leaving today. I will notify you of your results once received.   You will be contacted regarding your referral to lung cancer screening and to the orthopedist.  Please let us know if you have not been contacted within two weeks.   It was a pleasure to see you today!  Preventive Care 52-57 Years Old, Male Preventive care refers to lifestyle choices and visits with your health care provider that can promote health and wellness. Preventive care visits are also called wellness exams. What can I expect for my preventive care visit? Counseling During your preventive care visit, your health care provider may ask about your: Medical history, including: Past medical problems. Family medical history. Current health, including: Emotional well-being. Home life and relationship well-being. Sexual activity. Lifestyle, including: Alcohol, nicotine or tobacco, and drug use. Access to firearms. Diet, exercise, and sleep habits. Safety issues such as seatbelt and bike helmet use. Sunscreen use. Work and work Astronomer. Physical exam Your health care provider will check your: Height and weight. These may be used to calculate your BMI (body mass index). BMI is a measurement that tells if you are at a healthy weight. Waist circumference. This measures the distance around your waistline. This measurement also tells if you are at a healthy weight and may help predict your risk of certain diseases, such as type 2 diabetes and high blood pressure. Heart rate and blood pressure. Body temperature. Skin for abnormal spots. What immunizations do I need? Vaccines are usually given at various ages, according to a schedule. Your health care provider will recommend vaccines for you based on your age, medical history, and lifestyle or other factors, such as travel or where you work. What tests do I need? Screening Your health care provider may recommend screening tests for certain  conditions. This may include: Lipid and cholesterol levels. Diabetes screening. This is done by checking your blood sugar (glucose) after you have not eaten for a while (fasting). Hepatitis B test. Hepatitis C test. HIV (human immunodeficiency virus) test. STI (sexually transmitted infection) testing, if you are at risk. Lung cancer screening. Prostate cancer screening. Colorectal cancer screening. Talk with your health care provider about your test results, treatment options, and if necessary, the need for more tests. Follow these instructions at home: Eating and drinking  Eat a diet that includes fresh fruits and vegetables, whole grains, lean protein, and low-fat dairy products. Take vitamin and mineral supplements as recommended by your health care provider. Do not drink alcohol if your health care provider tells you not to drink. If you drink alcohol: Limit how much you have to 0-2 drinks a day. Know how much alcohol is in your drink. In the [place] one drink equals one 12 oz bottle of beer (355 mL), one 5 oz glass of wine (148 mL), or one 1 oz glass of hard liquor (44 mL). Lifestyle Brush your teeth every morning and night with fluoride toothpaste. Floss one time each day. Exercise for at least 30 minutes 5 or more days each week. Do not use any products that contain nicotine or tobacco. These products include cigarettes, chewing tobacco, and vaping devices, such as e-cigarettes. If you need help quitting, ask your health care provider. Do not use drugs. If you are sexually active, practice safe sex. Use a condom or other form of protection to prevent STIs. Take aspirin only as told by your health care provider. Make sure that you understand how much to take and  what form to take. Work with your health care provider to find out whether it is safe and beneficial for you to take aspirin daily. Find healthy ways to manage stress, such as: Meditation, yoga, or listening to  music. Journaling. Talking to a trusted person. Spending time with friends and family. Minimize exposure to UV radiation to reduce your risk of skin cancer. Safety Always wear your seat belt while driving or riding in a vehicle. Do not drive: If you have been drinking alcohol. Do not ride with someone who has been drinking. When you are tired or distracted. While texting. If you have been using any mind-altering substances or drugs. Wear a helmet and other protective equipment during sports activities. If you have firearms in your house, make sure you follow all gun safety procedures. What's next? Go to your health care provider once a year for an annual wellness visit. Ask your health care provider how often you should have your eyes and teeth checked. Stay up to date on all vaccines. This information is not intended to replace advice given to you by your health care provider. Make sure you discuss any questions you have with your health care provider. Document Revised: 06/29/2020 Document Reviewed: 06/29/2020 Elsevier Patient Education  2022 [employer].

## 2020-11-30 NOTE — Addendum Note (Signed)
Addended by: Wendie Simmer B on: 11/30/2020 02:27 PM   Modules accepted: Orders

## 2020-11-30 NOTE — Assessment & Plan Note (Signed)
Controlled in the office today, continue HCTZ 25 mg. CMP pending.  

## 2020-11-30 NOTE — Assessment & Plan Note (Signed)
Smoker for 30 years, unsure if he qualifies for lung cancer screening program but will still place referral.  He agrees. Advised him to stop smoking.

## 2020-11-30 NOTE — Assessment & Plan Note (Signed)
Discussed to resume Crestor 20 mg, he agrees and will do.  Repeat lipid panel pending.

## 2020-11-30 NOTE — Progress Notes (Signed)
Subjective:    Patient ID: Noah Wright, male    DOB: 1965/09/20, 55 y.o.   MRN: [id number]  HPI  Noah Wright is a very pleasant 55 y.o. male who presents today for complete physical and follow up of chronic conditions.  He would also like to discuss chronic bilateral knee pain. He stopped his Crestor one week ago, no significant improvement in pain. He would like to see his orthopedic surgeon again, did see Dr. August Saucer years ago, underwent MRI of right knee.   Immunizations: -Tetanus: 2020 -Influenza: Due today -Covid-19: 3 vaccines -Shingles: Never completed    Diet: Fair diet.  Exercise: No regular exercise. Active   Eye exam: No recent exam Dental exam: No recent exam   Colonoscopy: Completed over 10 years ago PSA: Due  Lung Cancer Screening: Never completed. Smoking for 30 years, smokes 5-10 cigarettes daily.   BP Readings from Last 3 Encounters:  11/30/20 118/76  07/31/19 118/80  10/01/18 136/84       Review of Systems  Constitutional:  Negative for unexpected weight change.  HENT:  Negative for rhinorrhea.   Eyes:  Negative for visual disturbance.  Respiratory:  Negative for shortness of breath.   Cardiovascular:  Negative for chest pain.  Gastrointestinal:  Negative for constipation and diarrhea.  Genitourinary:  Negative for difficulty urinating.  Musculoskeletal:  Positive for arthralgias.  Skin:  Negative for rash.  Allergic/Immunologic: Negative for environmental allergies.  Neurological:  Negative for dizziness and headaches.  Psychiatric/Behavioral:  The patient is not nervous/anxious.         Past Medical History:  Diagnosis Date   Asthma    Diverticulitis    GERD (gastroesophageal reflux disease)    Herpes zoster without complication 10/01/2018   Hyperlipidemia    Hypertension    Migraines    Shingles    Smoker 06/27/2012    Social History   Socioeconomic History   Marital status: Married    Spouse name: Not on file   Number of  children: Not on file   Years of education: Not on file   Highest education level: Not on file  Occupational History   Not on file  Tobacco Use   Smoking status: Light Smoker    Packs/day: 0.50    Years: 30.00    Pack years: 15.00    Types: Cigarettes   Smokeless tobacco: Never  Substance and Sexual Activity   Alcohol use: No   Drug use: No   Sexual activity: Not on file  Other Topics Concern   Not on file  Social History Narrative   Married.   3 children.   Works in Field seismologist.   Enjoys spending time outdoors.   Social Determinants of Health   Financial Resource Strain: Not on file  Food Insecurity: Not on file  Transportation Needs: Not on file  Physical Activity: Not on file  Stress: Not on file  Social Connections: Not on file  Intimate Partner Violence: Not on file    Past Surgical History:  Procedure Laterality Date   DG GALL BLADDER  1981    Family History  Problem Relation Age of Onset   Arthritis Mother    Hearing loss Mother    Heart disease Mother    Hypertension Father    Alcohol abuse Father    Cancer Father        bladder/kidney   Hearing loss Father    Hyperlipidemia Father     Allergies  Allergen Reactions  Lipitor [Atorvastatin] Other (See Comments)    Myalgias, weakness (20 mg)    Current Outpatient Medications on File Prior to Visit  Medication Sig Dispense Refill   hydrochlorothiazide (HYDRODIURIL) 25 MG tablet Take 1 tablet (25 mg total) by mouth daily. for blood pressure. Office visit required for further refills. 30 tablet 0   rosuvastatin (CRESTOR) 20 MG tablet Take 1 tablet (20 mg total) by mouth every evening. for cholesterol. Office visit required for further refills. (Patient not taking: Reported on 11/30/2020) 30 tablet 0   VENTOLIN HFA 108 (90 Base) MCG/ACT inhaler INHALE 2 PUFFS INTO THE LUNGS EVERY 6 HOURS AS NEEDED FOR WHEEZE OR SHORTNESS OF BREATH (Patient not taking: Reported on 11/30/2020) 18 g 0   No current  facility-administered medications on file prior to visit.    BP 118/76   Pulse 74   Temp 97.7 F (36.5 C) (Temporal)   Ht 5\' 11"  (1.803 m)   Wt 225 lb (102.1 kg)   SpO2 97%   BMI 31.38 kg/m  Objective:   Physical Exam HENT:     Right Ear: Tympanic membrane and ear canal normal.     Left Ear: Tympanic membrane and ear canal normal.     Nose: Nose normal.     Right Sinus: No maxillary sinus tenderness or frontal sinus tenderness.     Left Sinus: No maxillary sinus tenderness or frontal sinus tenderness.  Eyes:     Conjunctiva/sclera: Conjunctivae normal.  Neck:     Thyroid: No thyromegaly.     Vascular: No carotid bruit.  Cardiovascular:     Rate and Rhythm: Normal rate and regular rhythm.     Heart sounds: Normal heart sounds.  Pulmonary:     Effort: Pulmonary effort is normal.     Breath sounds: Normal breath sounds. No wheezing or rales.  Abdominal:     General: Bowel sounds are normal.     Palpations: Abdomen is soft.     Tenderness: There is no abdominal tenderness.  Musculoskeletal:        General: Normal range of motion.     Cervical back: Neck supple.  Skin:    General: Skin is warm and dry.  Neurological:     Mental Status: He is alert and oriented to person, place, and time.     Cranial Nerves: No cranial nerve deficit.     Deep Tendon Reflexes: Reflexes are normal and symmetric.  Psychiatric:        Mood and Affect: Mood normal.          Assessment & Plan:      This visit occurred during the SARS-CoV-2 public health emergency.  Safety protocols were in place, including screening questions prior to the visit, additional usage of staff PPE, and extensive cleaning of exam room while observing appropriate contact time as indicated for disinfecting solutions.

## 2020-11-30 NOTE — Assessment & Plan Note (Signed)
Discussed the importance of a healthy diet and regular exercise in order for weight loss, and to reduce the risk of further co-morbidity. ? ?Repeat A1C pending. ?

## 2020-11-30 NOTE — Assessment & Plan Note (Signed)
Referral placed to orthopedics.

## 2020-11-30 NOTE — Assessment & Plan Note (Signed)
Shingrix and influenza due and provided today. PSA due and pending. Colonoscopy due, referral placed to GI.  Discussed the importance of a healthy diet and regular exercise in order for weight loss, and to reduce the risk of further co-morbidity.  Exam today stable. Labs pending.

## 2020-11-30 NOTE — Assessment & Plan Note (Signed)
Stable per patient, no concerns. Infrequent use of albuterol.  Refill provided today.

## 2020-12-06 ENCOUNTER — Other Ambulatory Visit: Payer: Self-pay

## 2020-12-06 DIAGNOSIS — Z1211 Encounter for screening for malignant neoplasm of colon: Secondary | ICD-10-CM

## 2020-12-06 MED ORDER — CLENPIQ 10-3.5-12 MG-GM -GM/160ML PO SOLN: 1.0000 | Freq: Once | ORAL | 0 refills | Status: AC

## 2020-12-06 NOTE — Progress Notes (Signed)
Gastroenterology Pre-Procedure Review  Request Date: 01/05/21 Requesting Physician: Dr. Servando Snare  PATIENT REVIEW QUESTIONS: The patient responded to the following health history questions as indicated:    1. Are you having any GI issues?  IBS symptoms. (No office visit needed)  2. Do you have a personal history of Polyps? no 3. Do you have a family history of Colon Cancer or Polyps? no 4. Diabetes Mellitus?  Prediabetic 5. Joint replacements in the past 12 months?no 6. Major health problems in the past 3 months?no 7. Any artificial heart valves, MVP, or defibrillator?no    MEDICATIONS & ALLERGIES:    Patient reports the following regarding taking any anticoagulation/antiplatelet therapy:   Plavix, Coumadin, Eliquis, Xarelto, Lovenox, Pradaxa, Brilinta, or Effient? no Aspirin? no  Patient confirms/reports the following medications:  Current Outpatient Medications  Medication Sig Dispense Refill   albuterol (VENTOLIN HFA) 108 (90 Base) MCG/ACT inhaler INHALE 2 PUFFS INTO THE LUNGS EVERY 6 HOURS AS NEEDED FOR WHEEZE OR SHORTNESS OF BREATH 18 g 0   hydrochlorothiazide (HYDRODIURIL) 25 MG tablet Take 1 tablet (25 mg total) by mouth daily. for blood pressure. Office visit required for further refills. 30 tablet 0   rosuvastatin (CRESTOR) 20 MG tablet Take 1 tablet (20 mg total) by mouth every evening. for cholesterol. 90 tablet 3   No current facility-administered medications for this visit.    Patient confirms/reports the following allergies:  Allergies  Allergen Reactions   Lipitor [Atorvastatin] Other (See Comments)    Myalgias, weakness (20 mg)    No orders of the defined types were placed in this encounter.   AUTHORIZATION INFORMATION Primary Insurance: 1D#: Group #:  Secondary Insurance: 1D#: Group #:  SCHEDULE INFORMATION: Date: 01/05/21 Time: Location: [facility]

## 2020-12-23 ENCOUNTER — Ambulatory Visit: Payer: Self-pay

## 2020-12-23 ENCOUNTER — Ambulatory Visit: Payer: Managed Care, Other (non HMO) | Admitting: Orthopedic Surgery

## 2020-12-23 ENCOUNTER — Ambulatory Visit (INDEPENDENT_AMBULATORY_CARE_PROVIDER_SITE_OTHER): Payer: Managed Care, Other (non HMO)

## 2020-12-23 ENCOUNTER — Other Ambulatory Visit: Payer: Self-pay

## 2020-12-23 ENCOUNTER — Encounter: Payer: Self-pay | Admitting: Orthopedic Surgery

## 2020-12-23 DIAGNOSIS — M25561 Pain in right knee: Secondary | ICD-10-CM

## 2020-12-23 DIAGNOSIS — M25562 Pain in left knee: Secondary | ICD-10-CM | POA: Diagnosis not present

## 2020-12-23 NOTE — Progress Notes (Signed)
Office Visit Note   Patient: Noah Wright           Date of Birth: Apr 24, 1965           MRN: [id number] Visit Date: 12/23/2020 Requested by: Doreene Nest, NP [address] [place],  [place] [address] PCP: Doreene Nest, NP  Subjective: Chief Complaint  Patient presents with   Right Knee - New Patient (Initial Visit)   Left Knee - New Patient (Initial Visit)    HPI: Noah Wright is a 55 year old patient with bilateral knee pain.  States that the stiffness has increased in pain is slightly worse than it has been over the past year.  He cannot go up and down stairs in a tandem fashion.  Hard for him to squat but he is still able to do it.  Has had previous fracture of both the left and right femur.  He is able to climb a ladder injury and he can walk flat ground with fairly unlimited distance.  Currently not taking any medication for the problem.  Pain does not keep him from sleep at night.  Does not really have focal pain in the knee on either side.  Denies much in the way of mechanical symptoms.  Does not do any bracing.  No prior knee surgeries.              ROS: All systems reviewed are negative as they relate to the chief complaint within the history of present illness.  Patient denies  fevers or chills.   Assessment & Plan: Visit Diagnoses:  1. Left knee pain, unspecified chronicity   2. Right knee pain, unspecified chronicity     Plan: Impression is bilateral knee pain with significant osteoarthritis in the medial compartments and to a lesser degree patellofemoral compartments in both knees.  Range of motion is excellent with no effusion.  In general he is becoming slightly more symptomatic but has excellent quad strength and no real flexion contracture developing yet.  I think that his function and clinical symptoms do not warrant any intervention yet.  Should his symptoms worsen I would start with anti-inflammatories and then moved to cortisone alternating with gel injections  if his symptoms progress.  At this point it looks like he may delay his need for partial versus complete knee replacement until his 51s.  Follow-up as needed.  Follow-Up Instructions: Return if symptoms worsen or fail to improve.   Orders:  Orders Placed This Encounter  Procedures   XR KNEE 3 VIEW RIGHT   XR KNEE 3 VIEW LEFT   No orders of the defined types were placed in this encounter.     Procedures: No procedures performed   Clinical Data: No additional findings.  Objective: Vital Signs: There were no vitals taken for this visit.  Physical Exam:   Constitutional: Patient appears well-developed HEENT:  Head: Normocephalic Eyes:EOM are normal Neck: Normal range of motion Cardiovascular: Normal rate Pulmonary/chest: Effort normal Neurologic: Patient is alert Skin: Skin is warm Psychiatric: Patient has normal mood and affect   Ortho Exam: Ortho exam demonstrates palpable pedal pulses.  Slight varus alignment bilaterally.  Full range of motion of both knees with no effusion in either knee.  Collateral and cruciate ligaments are stable.  No significant patellofemoral crepitus is present with no patellar apprehension and intact extensor mechanism.  No groin pain with internal and external rotation of either hip.  Specialty Comments:  No specialty comments available.  Imaging: XR  KNEE 3 VIEW LEFT  Result Date: 12/23/2020 AP axillary outlet radiographs left knee reviewed.  Slight varus alignment present.  Mild to moderate arthritic changes present in the lateral and patellofemoral compartments with more severe arthritic changes present in the medial compartment.  No acute fracture.  Patella height normal relative to distal femur.  XR KNEE 3 VIEW RIGHT  Result Date: 12/23/2020 AP axillary outlet radiographs right knee reviewed.  Slight varus alignment present.  Bone-on-bone changes present in the medial compartment.  Arthritic changes less severe in the patellofemoral and  lateral compartment.  No acute fracture.  Patella height normal relative to distal femur.    PMFS History: Patient Active Problem List   Diagnosis Date Noted   Chronic pain of both knees 11/30/2020   Hyperlipidemia 07/31/2019   Prediabetes 07/31/2019   Preventative health care 03/24/2018   Pulmonary emphysema (HCC) 12/24/2017   Bronchiectasis (HCC) 07/11/2017   Essential hypertension 09/14/2016   Obesity (BMI 30-39.9) 09/14/2016   Tobacco dependence 06/27/2012   Past Medical History:  Diagnosis Date   Asthma    Diverticulitis    GERD (gastroesophageal reflux disease)    Herpes zoster without complication 10/01/2018   Hyperlipidemia    Hypertension    Migraines    Shingles    Smoker 06/27/2012    Family History  Problem Relation Age of Onset   Arthritis Mother    Hearing loss Mother    Heart disease Mother    Hypertension Father    Alcohol abuse Father    Cancer Father        bladder/kidney   Hearing loss Father    Hyperlipidemia Father     Past Surgical History:  Procedure Laterality Date   DG GALL BLADDER  1981   Social History   Occupational History   Not on file  Tobacco Use   Smoking status: Light Smoker    Packs/day: 0.50    Years: 30.00    Pack years: 15.00    Types: Cigarettes   Smokeless tobacco: Never  Substance and Sexual Activity   Alcohol use: No   Drug use: No   Sexual activity: Not on file

## 2020-12-24 ENCOUNTER — Other Ambulatory Visit: Payer: Self-pay | Admitting: Primary Care

## 2020-12-24 DIAGNOSIS — I10 Essential (primary) hypertension: Secondary | ICD-10-CM

## 2021-01-04 ENCOUNTER — Telehealth: Payer: Self-pay

## 2021-01-04 NOTE — Telephone Encounter (Signed)
Rescheduled for 02/02/2021 due to covid exposure called endo ND SENT NEW REFFERAL AND NEW COMMUNICATIONS

## 2021-01-05 ENCOUNTER — Ambulatory Visit
Admission: RE | Admit: 2021-01-05 | Payer: Managed Care, Other (non HMO) | Source: Home / Self Care | Admitting: Gastroenterology

## 2021-01-05 ENCOUNTER — Encounter: Admission: RE | Payer: Self-pay | Source: Home / Self Care

## 2021-01-05 SURGERY — COLONOSCOPY WITH PROPOFOL
Anesthesia: General

## 2021-01-20 ENCOUNTER — Other Ambulatory Visit: Payer: Self-pay

## 2021-01-20 DIAGNOSIS — F17210 Nicotine dependence, cigarettes, uncomplicated: Secondary | ICD-10-CM

## 2021-01-20 DIAGNOSIS — Z87891 Personal history of nicotine dependence: Secondary | ICD-10-CM

## 2021-02-01 ENCOUNTER — Encounter: Payer: Self-pay | Admitting: Gastroenterology

## 2021-02-01 ENCOUNTER — Other Ambulatory Visit: Payer: Self-pay

## 2021-02-01 DIAGNOSIS — Z1211 Encounter for screening for malignant neoplasm of colon: Secondary | ICD-10-CM

## 2021-02-02 ENCOUNTER — Ambulatory Visit
Admission: RE | Admit: 2021-02-02 | Discharge: 2021-02-02 | Disposition: A | Discharge status: Home / Self Care | Payer: Managed Care, Other (non HMO) | Attending: Gastroenterology | Admitting: Gastroenterology

## 2021-02-02 ENCOUNTER — Encounter: Payer: Self-pay | Admitting: Gastroenterology

## 2021-02-02 ENCOUNTER — Encounter
Admission: RE | Disposition: A | Discharge status: Home / Self Care | Payer: Self-pay | Source: Home / Self Care | Attending: Gastroenterology

## 2021-02-02 ENCOUNTER — Ambulatory Visit: Payer: Managed Care, Other (non HMO) | Admitting: Certified Registered"

## 2021-02-02 ENCOUNTER — Other Ambulatory Visit: Payer: Self-pay

## 2021-02-02 DIAGNOSIS — I10 Essential (primary) hypertension: Secondary | ICD-10-CM | POA: Diagnosis not present

## 2021-02-02 DIAGNOSIS — F17210 Nicotine dependence, cigarettes, uncomplicated: Secondary | ICD-10-CM | POA: Insufficient documentation

## 2021-02-02 DIAGNOSIS — Z1211 Encounter for screening for malignant neoplasm of colon: Secondary | ICD-10-CM | POA: Diagnosis not present

## 2021-02-02 DIAGNOSIS — Z79899 Other long term (current) drug therapy: Secondary | ICD-10-CM | POA: Insufficient documentation

## 2021-02-02 DIAGNOSIS — K5730 Diverticulosis of large intestine without perforation or abscess without bleeding: Secondary | ICD-10-CM | POA: Insufficient documentation

## 2021-02-02 DIAGNOSIS — K641 Second degree hemorrhoids: Secondary | ICD-10-CM | POA: Insufficient documentation

## 2021-02-02 DIAGNOSIS — J449 Chronic obstructive pulmonary disease, unspecified: Secondary | ICD-10-CM | POA: Diagnosis not present

## 2021-02-02 DIAGNOSIS — E785 Hyperlipidemia, unspecified: Secondary | ICD-10-CM | POA: Insufficient documentation

## 2021-02-02 HISTORY — PX: COLONOSCOPY WITH PROPOFOL: SHX5780

## 2021-02-02 SURGERY — COLONOSCOPY WITH PROPOFOL
Anesthesia: General

## 2021-02-02 MED ORDER — PROPOFOL 10 MG/ML IV BOLUS
INTRAVENOUS | Status: DC | PRN
Start: 2021-02-02 — End: 2021-02-02
  Administered 2021-02-02: 100 mg via INTRAVENOUS

## 2021-02-02 MED ORDER — GLYCOPYRROLATE 0.2 MG/ML IJ SOLN
INTRAMUSCULAR | Status: DC | PRN
  Administered 2021-02-02: .2 mg via INTRAVENOUS

## 2021-02-02 MED ORDER — SODIUM CHLORIDE 0.9 % IV SOLN: INTRAVENOUS | Status: DC

## 2021-02-02 MED ORDER — PROPOFOL 500 MG/50ML IV EMUL
INTRAVENOUS | Status: DC | PRN
Start: 2021-02-02 — End: 2021-02-02
  Administered 2021-02-02: 150 ug/kg/min via INTRAVENOUS

## 2021-02-02 NOTE — Op Note (Signed)
[facility] Gastroenterology Patient Name: Noah Wright Procedure Date: 02/02/2021 10:20 AM MRN: [id number] Account #: [id number] Date of Birth: 1965-04-20 Admit Type: Outpatient Age: 56 Room: [facility] ENDO ROOM 4 Gender: Male Note Status: Finalized Instrument Name: Park Meo [id number] Procedure:             Colonoscopy Indications:           Screening for colorectal malignant neoplasm Providers:             Lucilla Lame MD, MD Referring MD:          Pleas Koch (Referring MD) Medicines:             Propofol per Anesthesia Complications:         No immediate complications. Procedure:             Pre-Anesthesia Assessment:                        - Prior to the procedure, a History and Physical was                         performed, and patient medications and allergies were                         reviewed. The patient's tolerance of previous                         anesthesia was also reviewed. The risks and benefits                         of the procedure and the sedation options and risks                         were discussed with the patient. All questions were                         answered, and informed consent was obtained. Prior                         Anticoagulants: The patient has taken no previous                         anticoagulant or antiplatelet agents. ASA Grade                         Assessment: II - A patient with mild systemic disease.                         After reviewing the risks and benefits, the patient                         was deemed in satisfactory condition to undergo the                         procedure.                        After obtaining informed consent, the colonoscope was  passed under direct vision. Throughout the procedure,                         the patient's blood pressure, pulse, and oxygen                         saturations were monitored continuously. The                          Colonoscope was introduced through the anus and                         advanced to the the cecum, identified by appendiceal                         orifice and ileocecal valve. The colonoscopy was                         performed without difficulty. The patient tolerated                         the procedure well. The quality of the bowel                         preparation was excellent. Findings:      The perianal and digital rectal examinations were normal.      Multiple small-mouthed diverticula were found in the sigmoid colon.      Non-bleeding internal hemorrhoids were found during retroflexion. The       hemorrhoids were Grade II (internal hemorrhoids that prolapse but reduce       spontaneously). Impression:            - Diverticulosis in the sigmoid colon.                        - Non-bleeding internal hemorrhoids.                        - No specimens collected. Recommendation:        - Discharge patient to home.                        - Resume previous diet.                        - Continue present medications.                        - Repeat colonoscopy in 10 years for screening                         purposes. Procedure Code(s):     --- Professional ---                        [phone], Colonoscopy, flexible; diagnostic, including                         collection of specimen(s) by brushing or washing, when  performed (separate procedure) Diagnosis Code(s):     --- Professional ---                        Z12.11, Encounter for screening for malignant neoplasm                         of colon CPT copyright 2019 American Medical Association. All rights reserved. The codes documented in this report are preliminary and upon coder review may  be revised to meet current compliance requirements. Lucilla Lame MD, MD 02/02/2021 10:39:30 AM This report has been signed electronically. Number of Addenda: 0 Note Initiated On: 02/02/2021 10:20 AM Scope Withdrawal  Time: 0 hours 7 minutes 35 seconds  Total Procedure Duration: 0 hours 11 minutes 2 seconds  Estimated Blood Loss:  Estimated blood loss: none.      [facility]

## 2021-02-02 NOTE — Transfer of Care (Signed)
Immediate Anesthesia Transfer of Care Note  Patient: Noah Wright  Procedure(s) Performed: COLONOSCOPY WITH PROPOFOL  Patient Location: PACU and Endoscopy Unit  Anesthesia Type:General  Level of Consciousness: drowsy  Airway & Oxygen Therapy: Patient Spontanous Breathing  Post-op Assessment: Report given to RN  Post vital signs: stable  Last Vitals:  Vitals Value Taken Time  BP    Temp    Pulse    Resp    SpO2      Last Pain:  Vitals:   02/02/21 0952  TempSrc: Temporal  PainSc: 0-No pain         Complications: No notable events documented.

## 2021-02-02 NOTE — Anesthesia Postprocedure Evaluation (Signed)
Anesthesia Post Note  Patient: Noah Wright  Procedure(s) Performed: COLONOSCOPY WITH PROPOFOL  Patient location during evaluation: Endoscopy Anesthesia Type: General Level of consciousness: awake and alert Pain management: pain level controlled Vital Signs Assessment: post-procedure vital signs reviewed and stable Respiratory status: spontaneous breathing, nonlabored ventilation, respiratory function stable and patient connected to nasal cannula oxygen Cardiovascular status: blood pressure returned to baseline and stable Postop Assessment: no apparent nausea or vomiting Anesthetic complications: no   No notable events documented.   Last Vitals:  Vitals:   02/02/21 1052 02/02/21 1102  BP: 100/77 121/82  Pulse:    Resp:    Temp:    SpO2:      Last Pain:  Vitals:   02/02/21 1112  TempSrc:   PainSc: 0-No pain                 Corinda Gubler

## 2021-02-02 NOTE — H&P (Signed)
Noah Minium, MD [facility] [address] [place], [place] [address] Phone: [phone] Fax : [phone]  Primary Care Physician:  Doreene Nest, NP Primary Gastroenterologist:  Dr. Servando Snare  Pre-Procedure History & Physical: HPI:  Noah Wright is a 56 y.o. male is here for a screening colonoscopy.   Past Medical History:  Diagnosis Date   Asthma    Diverticulitis    GERD (gastroesophageal reflux disease)    Herpes zoster without complication 10/01/2018   Hyperlipidemia    Hypertension    Migraines    Shingles    Smoker 06/27/2012    Past Surgical History:  Procedure Laterality Date   DG GALL BLADDER  1981    Prior to Admission medications   Medication Sig Start Date End Date Taking? Authorizing Provider  hydrochlorothiazide (HYDRODIURIL) 25 MG tablet TAKE 1 TABLET BY MOUTH DAILY FOR BLOOD PRESSURE 12/25/20  Yes Doreene Nest, NP  rosuvastatin (CRESTOR) 20 MG tablet Take 1 tablet (20 mg total) by mouth every evening. for cholesterol. 11/30/20  Yes Doreene Nest, NP  albuterol (VENTOLIN HFA) 108 (90 Base) MCG/ACT inhaler INHALE 2 PUFFS INTO THE LUNGS EVERY 6 HOURS AS NEEDED FOR WHEEZE OR SHORTNESS OF BREATH 11/30/20   Doreene Nest, NP    Allergies as of 02/01/2021 - Review Complete 02/01/2021  Allergen Reaction Noted   Lipitor [atorvastatin] Other (See Comments) 10/13/2013    Family History  Problem Relation Age of Onset   Arthritis Mother    Hearing loss Mother    Heart disease Mother    Hypertension Father    Alcohol abuse Father    Cancer Father        bladder/kidney   Hearing loss Father    Hyperlipidemia Father     Social History   Socioeconomic History   Marital status: Married    Spouse name: Not on file   Number of children: Not on file   Years of education: Not on file   Highest education level: Not on file  Occupational History   Not on file  Tobacco Use   Smoking status: Light Smoker    Packs/day: 0.50    Years:  30.00    Pack years: 15.00    Types: Cigarettes   Smokeless tobacco: Current    Types: Snuff  Substance and Sexual Activity   Alcohol use: No   Drug use: No   Sexual activity: Not on file  Other Topics Concern   Not on file  Social History Narrative   Married.   3 children.   Works in [employer] se[employer].   Enjoys spending time outdoors.   Social Determinants of Health   Financial Resource Strain: Not on file  Food Insecurity: Not on file  Transportation Needs: Not on file  Physical Activity: Not on file  Stress: Not on file  Social Connections: Not on file  Intimate Partner Violence: Not on file    Review of Systems: See HPI, otherwise negative ROS  Physical Exam: BP 139/89    Pulse 70    Temp (!) 96.8 F (36 C) (Temporal)    Resp 17    Ht 5\' 11"  (1.803 m)    Wt 102.1 kg    SpO2 97%    BMI 31.38 kg/m  General:   Alert,  pleasant and cooperative in NAD Head:  Normocephalic and atraumatic. Neck:  Supple; no masses or thyromegaly. Lungs:  Clear throughout to auscultation.    Heart:  Regular rate and rhythm.  Abdomen:  Soft, nontender and nondistended. Normal bowel sounds, without guarding, and without rebound.   Neurologic:  Alert and  oriented x4;  grossly normal neurologically.  Impression/Plan: Noah Wright is now here to undergo a screening colonoscopy.  Risks, benefits, and alternatives regarding colonoscopy have been reviewed with the patient.  Questions have been answered.  All parties agreeable.

## 2021-02-02 NOTE — Anesthesia Preprocedure Evaluation (Signed)
Anesthesia Evaluation  Patient identified by MRN, date of birth, ID band Patient awake    Reviewed: Allergy & Precautions, NPO status , Patient's Chart, lab work & pertinent test results  History of Anesthesia Complications Negative for: history of anesthetic complications  Airway Mallampati: III  TM Distance: >3 FB Neck ROM: Full    Dental no notable dental hx. (+) Teeth Intact   Pulmonary asthma , neg sleep apnea, COPD, Current Smoker and Patient abstained from smoking.,    Pulmonary exam normal breath sounds clear to auscultation       Cardiovascular Exercise Tolerance: Good METShypertension, Pt. on medications (-) CAD and (-) Past MI (-) dysrhythmias  Rhythm:Regular Rate:Normal - Systolic murmurs    Neuro/Psych  Headaches, negative psych ROS   GI/Hepatic GERD  Controlled,(+)     (-) substance abuse  ,   Endo/Other  neg diabetes  Renal/GU negative Renal ROS     Musculoskeletal   Abdominal   Peds  Hematology   Anesthesia Other Findings Past Medical History: No date: Asthma No date: Diverticulitis No date: GERD (gastroesophageal reflux disease) 10/01/2018: Herpes zoster without complication No date: Hyperlipidemia No date: Hypertension No date: Migraines No date: Shingles 06/27/2012: Smoker  Reproductive/Obstetrics                             Anesthesia Physical Anesthesia Plan  ASA: 2  Anesthesia Plan: General   Post-op Pain Management: Minimal or no pain anticipated   Induction: Intravenous  PONV Risk Score and Plan: 2 and Propofol infusion, TIVA and Ondansetron  Airway Management Planned: Nasal Cannula  Additional Equipment: None  Intra-op Plan:   Post-operative Plan:   Informed Consent: I have reviewed the patients History and Physical, chart, labs and discussed the procedure including the risks, benefits and alternatives for the proposed anesthesia with the  patient or authorized representative who has indicated his/her understanding and acceptance.     Dental advisory given  Plan Discussed with: CRNA and Surgeon  Anesthesia Plan Comments: (Discussed risks of anesthesia with patient, including possibility of difficulty with spontaneous ventilation under anesthesia necessitating airway intervention, PONV, and rare risks such as cardiac or respiratory or neurological events, and allergic reactions. Discussed the role of CRNA in patient's perioperative care. Patient understands. Patient counseled on benefits of smoking cessation, and increased perioperative risks associated with continued smoking. )        Anesthesia Quick Evaluation

## 2021-02-03 ENCOUNTER — Encounter: Payer: Self-pay | Admitting: Gastroenterology

## 2021-03-01 ENCOUNTER — Other Ambulatory Visit: Payer: Self-pay

## 2021-03-01 ENCOUNTER — Ambulatory Visit (INDEPENDENT_AMBULATORY_CARE_PROVIDER_SITE_OTHER): Payer: Managed Care, Other (non HMO) | Admitting: Acute Care

## 2021-03-01 ENCOUNTER — Encounter: Payer: Self-pay | Admitting: Acute Care

## 2021-03-01 DIAGNOSIS — F17210 Nicotine dependence, cigarettes, uncomplicated: Secondary | ICD-10-CM

## 2021-03-01 NOTE — Patient Instructions (Signed)
Thank you for participating in the [facility]. °It was our pleasure to meet you today. °We will call you with the results of your scan within the next few days. °Your scan will be assigned a Lung RADS category score by the physicians reading the scans.  °This Lung RADS score determines follow up scanning.  °See below for description of categories, and follow up screening recommendations. °We will be in touch to schedule your follow up screening annually or based on recommendations of our providers. °We will fax a copy of your scan results to your Primary Care Physician, or the physician who referred you to the program, to ensure they have the results. °Please call the office if you have any questions or concerns regarding your scanning experience or results.  °Our office number is [phone]. °Please speak with Denise Phelps, RN. She is our Lung Cancer Screening RN. °If she is unavailable when you call, please have the office staff send her a message. She will return your call at her earliest convenience. °Remember, if your scan is normal, we will scan you annually as long as you continue to meet the criteria for the program. (Age 55-77, Current smoker or smoker who has quit within the last 15 years). °If you are a smoker, remember, quitting is the single most powerful action that you can take to decrease your risk of lung cancer and other pulmonary, breathing related problems. °We know quitting is hard, and we are here to help.  °Please let us know if there is anything we can do to help you meet your goal of quitting. °If you are a former smoker, congratulations. We are proud of you! Remain smoke free! °Remember you can refer friends or family members through the number above.  °We will screen them to make sure they meet criteria for the program. °Thank you for helping us take better care of you by participating in Lung Screening. ° °You can receive free nicotine replacement therapy  ( patches, gum or mints) by calling 1-800-QUIT NOW. Please call so we can get you on the path to becoming  a non-smoker. I know it is hard, but you can do this! ° °Lung RADS Categories: ° °Lung RADS 1: no nodules or definitely non-concerning nodules.  °Recommendation is for a repeat annual scan in 12 months. ° °Lung RADS 2:  nodules that are non-concerning in appearance and behavior with a very low likelihood of becoming an active cancer. °Recommendation is for a repeat annual scan in 12 months. ° °Lung RADS 3: nodules that are probably non-concerning , includes nodules with a low likelihood of becoming an active cancer.  Recommendation is for a 6-month repeat screening scan. Often noted after an upper respiratory illness. We will be in touch to make sure you have no questions, and to schedule your 6-month scan. ° °Lung RADS 4 A: nodules with concerning findings, recommendation is most often for a follow up scan in 3 months or additional testing based on our provider's assessment of the scan. We will be in touch to make sure you have no questions and to schedule the recommended 3 month follow up scan. ° °Lung RADS 4 B:  indicates findings that are concerning. We will be in touch with you to schedule additional diagnostic testing based on our provider's  assessment of the scan. ° °Hypnosis for smoking cessation  °[employer] °[phone] ° °Acupuncture for smoking cessation  °[facility] °[phone]  °

## 2021-03-01 NOTE — Progress Notes (Signed)
Virtual Visit via Telephone Note  I connected with Jeyren Danowski Bernales on 03/01/21 at  9:00 AM EST by telephone and verified that I am speaking with the correct person using two identifiers.  Location: Patient:  At home Provider:  [address], [place], [place], [address]    I discussed the limitations, risks, security and privacy concerns of performing an evaluation and management service by telephone and the availability of in person appointments. I also discussed with the patient that there may be a patient responsible charge related to this service. The patient expressed understanding and agreed to proceed.    Shared Decision Making Visit Lung Cancer Screening Program [phone])   Eligibility: Age 56 y.o. Pack Years Smoking History Calculation 21.5 pack year smoking history (# packs/per year x # years smoked) Recent History of coughing up blood  no Unexplained weight loss? no ( >Than 15 pounds within the last 6 months ) Prior History Lung / other cancer no (Diagnosis within the last 5 years already requiring surveillance chest CT Scans). Smoking Status Current Smoker Former Smokers: Years since quit:  NA  Quit Date:  NA  Visit Components: Discussion included one or more decision making aids. yes Discussion included risk/benefits of screening. yes Discussion included potential follow up diagnostic testing for abnormal scans. yes Discussion included meaning and risk of over diagnosis. yes Discussion included meaning and risk of False Positives. yes Discussion included meaning of total radiation exposure. yes  Counseling Included: Importance of adherence to annual lung cancer LDCT screening. yes Impact of comorbidities on ability to participate in the program. yes Ability and willingness to under diagnostic treatment. yes  Smoking Cessation Counseling: Current Smokers:  Discussed importance of smoking cessation. yes Information about tobacco cessation classes and  interventions provided to patient. yes Patient provided with "ticket" for LDCT Scan. yes Symptomatic Patient. no  Counseling NA Diagnosis Code: Tobacco Use Z72.0 Asymptomatic Patient yes  Counseling (Intermediate counseling: > three minutes counseling) F7902 Former Smokers:  Discussed the importance of maintaining cigarette abstinence. yes Diagnosis Code: Personal History of Nicotine Dependence. I09.735 Information about tobacco cessation classes and interventions provided to patient. Yes Patient provided with "ticket" for LDCT Scan. yes Written Order for Lung Cancer Screening with LDCT placed in Epic. Yes (CT Chest Lung Cancer Screening Low Dose W/O CM) [id number] Z12.2-Screening of respiratory organs Z87.891-Personal history of nicotine dependence  I have spent 25 minutes of face to face/ virtual visit   time with  Mr. Carneiro discussing the risks and benefits of lung cancer screening. We viewed / discussed a power point together that explained in detail the above noted topics. We paused at intervals to allow for questions to be asked and answered to ensure understanding.We discussed that the single most powerful action that he can take to decrease his risk of developing lung cancer is to quit smoking. We discussed whether or not he is ready to commit to setting a quit date. We discussed options for tools to aid in quitting smoking including nicotine replacement therapy, non-nicotine medications, support groups, Quit Smart classes, and behavior modification. We discussed that often times setting smaller, more achievable goals, such as eliminating 1 cigarette a day for a week and then 2 cigarettes a day for a week can be helpful in slowly decreasing the number of cigarettes smoked. This allows for a sense of accomplishment as well as providing a clinical benefit. I provided  him  with smoking cessation  information  with contact information for community  resources, classes, free nicotine replacement  therapy, and access to mobile apps, text messaging, and on-line smoking cessation help. I have also provided  him  the office contact information in the event he needs to contact me, or the screening staff. We discussed the time and location of the scan, and that either Doroteo Glassman RN, Joella Prince, RN  or I will call / send a letter with the results within 24-72 hours of receiving them. The patient verbalized understanding of all of  the above and had no further questions upon leaving the office. They have my contact information in the event they have any further questions.  I spent 3 minutes counseling on smoking cessation and the health risks of continued tobacco abuse.  I explained to the patient that there has been a high incidence of coronary artery disease noted on these exams. I explained that this is a non-gated exam therefore degree or severity cannot be determined. This patient is on statin therapy. I have asked the patient to follow-up with their PCP regarding any incidental finding of coronary artery disease and management with diet or medication as their PCP  feels is clinically indicated. The patient verbalized understanding of the above and had no further questions upon completion of the visit.      Magdalen Spatz, NP 03/01/2021

## 2021-03-03 ENCOUNTER — Ambulatory Visit (HOSPITAL_COMMUNITY)
Admission: RE | Admit: 2021-03-03 | Discharge: 2021-03-03 | Disposition: A | Discharge status: Home / Self Care | Payer: Managed Care, Other (non HMO) | Source: Ambulatory Visit | Attending: Internal Medicine | Admitting: Internal Medicine

## 2021-03-03 ENCOUNTER — Other Ambulatory Visit: Payer: Self-pay

## 2021-03-03 DIAGNOSIS — Z87891 Personal history of nicotine dependence: Secondary | ICD-10-CM | POA: Diagnosis not present

## 2021-03-03 DIAGNOSIS — F17210 Nicotine dependence, cigarettes, uncomplicated: Secondary | ICD-10-CM | POA: Diagnosis present

## 2021-03-06 ENCOUNTER — Telehealth: Payer: Self-pay | Admitting: Acute Care

## 2021-03-06 ENCOUNTER — Other Ambulatory Visit: Payer: Self-pay | Admitting: Acute Care

## 2021-03-06 DIAGNOSIS — F17210 Nicotine dependence, cigarettes, uncomplicated: Secondary | ICD-10-CM

## 2021-03-06 NOTE — Telephone Encounter (Signed)
I have attempted to call the patint with the results of his low dose CT chest. There was no answer. I have left a HIPPA compliant message with the office number, and I have requested he call the office for the results. If we do not hear back from him we will call again.  Angelique Blonder, his scan was read as a LR 3, he needs a 6 month follow up. He has a 7.8 mm nodule , and what looks like mucus plugging and bronchiectasis. If he calls and you feel comfortable letting him know please feel free to do that. If not I will try again tomorrow. Thanks

## 2021-03-13 NOTE — Telephone Encounter (Signed)
Spoke with pt regarding CT results per Kandice Robinsons, NP. I advised pt that we will plan to repeat the CT scan in 6 months. PT had questions regarding having a PET scan done before then. I explained to the pt that the radiologist have not recommended a PET Scan at this time due to the size of the nodule. Pt stated that he will discuss this with his PCP. PT is aware that we have placed the order for a 6 mth f/u nodule CT scan. Nothing further needed at this time.

## 2021-03-15 ENCOUNTER — Encounter: Payer: Self-pay | Admitting: Orthopedic Surgery

## 2021-03-17 ENCOUNTER — Other Ambulatory Visit: Payer: Self-pay | Admitting: Primary Care

## 2021-03-17 DIAGNOSIS — J439 Emphysema, unspecified: Secondary | ICD-10-CM

## 2021-03-17 MED ORDER — ALBUTEROL SULFATE HFA 108 (90 BASE) MCG/ACT IN AERS: INHALATION_SPRAY | RESPIRATORY_TRACT | 0 refills | Status: DC

## 2021-03-21 NOTE — Telephone Encounter (Signed)
Hi Noah Wright, ? ?I spoke with this patient regarding his concerns, and he would like to know if a high dose CT scan can be obtained as opposed to the CT chest LSC nodule low dose scan. He's concerned that something may not be picked up  by repeating with the low dose scan. ? ?Can you discuss with patient? ? ?Thanks! ?Allie Bossier, NP-C ? ?

## 2021-03-22 ENCOUNTER — Telehealth: Payer: Self-pay | Admitting: Acute Care

## 2021-03-22 DIAGNOSIS — R911 Solitary pulmonary nodule: Secondary | ICD-10-CM

## 2021-03-22 NOTE — Telephone Encounter (Signed)
I have called the patient to discuss his concerns about waiting 6 months for a follow up scan. He was initially too busy to speak with me so I called him back as requested in 15 minutes.   I explained that the 6 month follow up is appropriate for the imaging, as the radiologist feels this is a mucus plug , as he has bronchiectasis per imaging. He is very anxious. Asked for a PET scan or an HRCT. I explained that the area of concern is too small for PET imaging, and that an HRCT was more than what was needed  for a nodule follow up. I have offered a 3 month follow up CT Chest without contrast as follow up. I explained this would have to go through his insurance, as this was not the radiologist recommended scan. He states it is fine to run this through his insurance and that he will pay out of pocket if necessary. He does not want to wait 6 months.  ?I have placed the order for a 3 month follow up CT Chest without contrast.  ?Denise, please cancel the low dose 6 month follow up.  ? ?I spent 25 minutes on the phone with his patient discussing this issue ?

## 2021-03-22 NOTE — Telephone Encounter (Signed)
Thank you, Maralyn Sago. ?I appreciate your time! ?

## 2021-04-04 ENCOUNTER — Telehealth: Payer: Self-pay

## 2021-04-04 DIAGNOSIS — J439 Emphysema, unspecified: Secondary | ICD-10-CM

## 2021-04-04 MED ORDER — ALBUTEROL SULFATE HFA 108 (90 BASE) MCG/ACT IN AERS: 2.0000 | INHALATION_SPRAY | Freq: Four times a day (QID) | RESPIRATORY_TRACT | 0 refills | Status: DC | PRN

## 2021-04-04 NOTE — Telephone Encounter (Signed)
Noted. Will try ProAir. ?Rx [facility] to [facility].  ?

## 2021-04-04 NOTE — Addendum Note (Signed)
Addended by: Doreene Nest on: 04/04/2021 01:07 PM ? ? Modules accepted: Orders ? ?

## 2021-04-04 NOTE — Telephone Encounter (Signed)
Is patient currently out of his albuterol inhaler? ?

## 2021-04-04 NOTE — Telephone Encounter (Signed)
Called patient he has been out for a while but does not use much. He but would like to have alternative sent in once Jae Dire is back in the office this week.  ?

## 2021-04-04 NOTE — Telephone Encounter (Signed)
Called patient reviewed all information and repeated back to me. Will call if any questions.  He will let us know if any information about coverage received by pharmacy  ?

## 2021-04-04 NOTE — Telephone Encounter (Signed)
Fax received medication not covered by insurance request change to alternative. Non were recommended by pharmacy   ?

## 2021-04-17 ENCOUNTER — Ambulatory Visit: Payer: Managed Care, Other (non HMO) | Admitting: Orthopedic Surgery

## 2021-04-19 ENCOUNTER — Ambulatory Visit: Payer: Managed Care, Other (non HMO) | Admitting: Surgical

## 2021-04-19 ENCOUNTER — Telehealth: Payer: Self-pay

## 2021-04-19 DIAGNOSIS — M170 Bilateral primary osteoarthritis of knee: Secondary | ICD-10-CM | POA: Diagnosis not present

## 2021-04-19 NOTE — Telephone Encounter (Signed)
Auth needed for bilat knee gel injection  

## 2021-04-23 ENCOUNTER — Encounter: Payer: Self-pay | Admitting: Orthopedic Surgery

## 2021-04-23 MED ORDER — LIDOCAINE HCL 1 % IJ SOLN
5.0000 mL | INTRAMUSCULAR | Status: AC | PRN
  Administered 2021-04-19: 5 mL

## 2021-04-23 MED ORDER — METHYLPREDNISOLONE ACETATE 40 MG/ML IJ SUSP
40.0000 mg | INTRAMUSCULAR | Status: AC | PRN
  Administered 2021-04-19: 40 mg via INTRA_ARTICULAR

## 2021-04-23 MED ORDER — BUPIVACAINE HCL 0.25 % IJ SOLN
4.0000 mL | INTRAMUSCULAR | Status: AC | PRN
  Administered 2021-04-19: 4 mL via INTRA_ARTICULAR

## 2021-04-23 NOTE — Progress Notes (Signed)
? ?Office Visit Note ?  ?Patient: Noah Wright           ?Date of Birth: [birth year]           ?MRN: [id number] ?Visit Date: 04/19/2021 ?Requested by: Doreene Nest, NP ?[address] ?[place],  [place] [address] ?PCP: Doreene Nest, NP ? ?Subjective: ?Chief Complaint  ?Patient presents with  ? Left Knee - Pain  ? Right Knee - Pain  ? ? ?HPI: Noah Wright is a 56 y.o. male who presents to the office complaining of bilateral knee pain.  Patient has history of bilateral knee osteoarthritis.  He states that his pain has been worsening over the last several months without injury.  He notes increase in stiffness with immobility.  No new injuries.  Denies any mechanical locking of either knee.  He is leaving for a trip to [place] with his family on Saturday and would like to have bilateral knee injections prior to departure to help with the walking he will be dealing..   ?             ?ROS: All systems reviewed are negative as they relate to the chief complaint within the history of present illness.  Patient denies fevers or chills. ? ?Assessment & Plan: ?Visit Diagnoses:  ?1. Primary osteoarthritis of knees, bilateral   ? ? ?Plan: Patient is a 56 year old male who presents for evaluation of bilateral knee pain.  He has history of bilateral knee osteoarthritis.  Pain has been worsening over the last several months since his last appointment with Dr. August Saucer in December 2022.  No new injury.  He is going on a trip to [place] this coming weekend and would like to have knee injections to help with the pain with ambulation.  Tolerated both injections well without difficulty.  Plan for him to follow-up with the office as needed.  Preapproved patient for bilateral knee gel injections to try after these cortisone injections wear off in the future. ? ?This patient is diagnosed with osteoarthritis of the knee(s).   ? ?Radiographs show evidence of joint space narrowing, osteophytes, subchondral sclerosis and/or  subchondral cysts.  This patient has knee pain which interferes with functional and activities of daily living.   ? ?This patient has experienced inadequate response, adverse effects and/or intolerance with conservative treatments such as acetaminophen, NSAIDS, topical creams, physical therapy or regular exercise, knee bracing and/or weight loss.  ? ?This patient has experienced inadequate response or has a contraindication to intra articular steroid injections for at least 3 months.  ? ?This patient is not scheduled to have a total knee replacement within 6 months of starting treatment with viscosupplementation. ? ? ?Follow-Up Instructions: No follow-ups on file.  ? ?Orders:  ?No orders of the defined types were placed in this encounter. ? ?No orders of the defined types were placed in this encounter. ? ? ? ? Procedures: ?Large Joint Inj: bilateral knee on 04/19/2021 12:41 PM ?Indications: diagnostic evaluation, joint swelling and pain ?Details: 18 G 1.5 in needle, superolateral approach ? ?Arthrogram: No ? ?Medications (Right): 5 mL lidocaine 1 %; 4 mL bupivacaine 0.25 %; 40 mg methylPREDNISolone acetate 40 MG/ML ?Medications (Left): 5 mL lidocaine 1 %; 4 mL bupivacaine 0.25 %; 40 mg methylPREDNISolone acetate 40 MG/ML ?Outcome: tolerated well, no immediate complications ?Procedure, treatment alternatives, risks and benefits explained, specific risks discussed. Consent was given by the patient. Immediately prior to procedure a time out was called to  verify the correct patient, procedure, equipment, support staff and site/side marked as required. Patient was prepped and draped in the usual sterile fashion.  ? ? ? ? ?Clinical Data: ?No additional findings. ? ?Objective: ?Vital Signs: There were no vitals taken for this visit. ? ?Physical Exam:  ?Constitutional: Patient appears well-developed ?HEENT:  ?Head: Normocephalic ?Eyes:EOM are normal ?Neck: Normal range of motion ?Cardiovascular: Normal rate ?Pulmonary/chest:  Effort normal ?Neurologic: Patient is alert ?Skin: Skin is warm ?Psychiatric: Patient has normal mood and affect ? ?Ortho Exam: Ortho exam demonstrates bilateral knees without effusion.  Tenderness over the medial and lateral joint lines of both knees.  No calf tenderness.  Negative Homans' sign.  No pain with hip range of motion of either leg.  Able to perform straight leg raise with both legs.  No cellulitis or skin changes noted to either knee. ? ?Specialty Comments:  ?No specialty comments available. ? ?Imaging: ?No results found. ? ? ?PMFS History: ?Patient Active Problem List  ? Diagnosis Date Noted  ? Colon cancer screening   ? Chronic pain of both knees 11/30/2020  ? Hyperlipidemia 07/31/2019  ? Prediabetes 07/31/2019  ? Preventative health care 03/24/2018  ? Pulmonary emphysema (HCC) 12/24/2017  ? Bronchiectasis (HCC) 07/11/2017  ? Essential hypertension 09/14/2016  ? Obesity (BMI 30-39.9) 09/14/2016  ? Tobacco dependence 06/27/2012  ? ?Past Medical History:  ?Diagnosis Date  ? Asthma   ? Diverticulitis   ? GERD (gastroesophageal reflux disease)   ? Herpes zoster without complication 10/01/2018  ? Hyperlipidemia   ? Hypertension   ? Migraines   ? Shingles   ? Smoker 06/27/2012  ?  ?Family History  ?Problem Relation Age of Onset  ? Arthritis Mother   ? Hearing loss Mother   ? Heart disease Mother   ? Hypertension Father   ? Alcohol abuse Father   ? Cancer Father   ?     bladder/kidney  ? Hearing loss Father   ? Hyperlipidemia Father   ?  ?Past Surgical History:  ?Procedure Laterality Date  ? COLONOSCOPY WITH PROPOFOL N/A 02/02/2021  ? Procedure: COLONOSCOPY WITH PROPOFOL;  Surgeon: Midge Minium, MD;  Location: [facility]COPY;  Service: Endoscopy;  Laterality: N/A;  ? DG GALL BLADDER  1981  ? ?Social History  ? ?Occupational History  ? Not on file  ?Tobacco Use  ? Smoking status: Every Day  ?  Packs/day: 0.50  ?  Years: 43.00  ?  Pack years: 21.50  ?  Types: Cigarettes  ? Smokeless tobacco: Current  ?  Types:  Snuff  ?Substance and Sexual Activity  ? Alcohol use: No  ? Drug use: No  ? Sexual activity: Not on file  ? ? ? ? ?  ?

## 2021-04-25 NOTE — Telephone Encounter (Signed)
Noted  

## 2021-05-08 ENCOUNTER — Other Ambulatory Visit: Payer: Self-pay | Admitting: Primary Care

## 2021-05-08 DIAGNOSIS — J439 Emphysema, unspecified: Secondary | ICD-10-CM

## 2021-05-26 ENCOUNTER — Encounter: Payer: Self-pay | Admitting: Orthopedic Surgery

## 2021-05-26 ENCOUNTER — Telehealth: Payer: Self-pay

## 2021-05-26 NOTE — Telephone Encounter (Signed)
VOB submitted for Durolane, bilateral knee. BV pending. 

## 2021-05-26 NOTE — Telephone Encounter (Signed)
Talked with patient concerning gel injection for bilateral knee. °

## 2021-05-31 ENCOUNTER — Telehealth: Payer: Self-pay

## 2021-05-31 NOTE — Telephone Encounter (Signed)
Faxed completed PA form to [employer] at [phone] for Durolane, bilateral knee. PA Pending 

## 2021-06-02 ENCOUNTER — Other Ambulatory Visit (HOSPITAL_COMMUNITY): Payer: Managed Care, Other (non HMO)

## 2021-06-09 ENCOUNTER — Telehealth: Payer: Self-pay

## 2021-06-09 ENCOUNTER — Other Ambulatory Visit: Payer: Self-pay

## 2021-06-09 ENCOUNTER — Ambulatory Visit
Admission: RE | Admit: 2021-06-09 | Discharge: 2021-06-09 | Disposition: A | Discharge status: Home / Self Care | Payer: Managed Care, Other (non HMO) | Source: Ambulatory Visit | Attending: Acute Care | Admitting: Acute Care

## 2021-06-09 DIAGNOSIS — R911 Solitary pulmonary nodule: Secondary | ICD-10-CM

## 2021-06-09 DIAGNOSIS — M170 Bilateral primary osteoarthritis of knee: Secondary | ICD-10-CM

## 2021-06-09 NOTE — Telephone Encounter (Signed)
Patient would like to know the status of the gel injection, is it still pending or is it approved?

## 2021-06-09 NOTE — Telephone Encounter (Signed)
Can you please advise?

## 2021-06-09 NOTE — Telephone Encounter (Signed)
See previous message in chart.  Left a VM for patient to CB to schedule. PA has been approved per [employer]

## 2021-06-09 NOTE — Telephone Encounter (Signed)
Called and left a Vm for patient to CB to schedule for gel injection with Dr. August Saucer.  Check referrals tab

## 2021-06-15 ENCOUNTER — Encounter: Payer: Self-pay | Admitting: Orthopedic Surgery

## 2021-06-15 ENCOUNTER — Ambulatory Visit: Payer: Managed Care, Other (non HMO) | Admitting: Orthopedic Surgery

## 2021-06-15 DIAGNOSIS — M1712 Unilateral primary osteoarthritis, left knee: Secondary | ICD-10-CM | POA: Diagnosis not present

## 2021-06-15 DIAGNOSIS — M170 Bilateral primary osteoarthritis of knee: Secondary | ICD-10-CM

## 2021-06-15 MED ORDER — SODIUM HYALURONATE 60 MG/3ML IX PRSY
60.0000 mg | PREFILLED_SYRINGE | INTRA_ARTICULAR | Status: AC | PRN
  Administered 2021-06-15: 60 mg via INTRA_ARTICULAR

## 2021-06-15 MED ORDER — LIDOCAINE HCL 1 % IJ SOLN
5.0000 mL | INTRAMUSCULAR | Status: AC | PRN
  Administered 2021-06-15: 5 mL

## 2021-06-15 NOTE — Progress Notes (Signed)
   Procedure Note  Patient: Noah Wright             Date of Birth: February 16, 1965           MRN: [id number]             Visit Date: 06/15/2021  Procedures: Visit Diagnoses:  1. Primary osteoarthritis of knees, bilateral     Large Joint Inj: L knee on 06/15/2021 2:01 PM Indications: diagnostic evaluation, joint swelling and pain Details: 18 G 1.5 in needle, superolateral approach  Arthrogram: No  Medications: 5 mL lidocaine 1 %; 60 mg Sodium Hyaluronate 60 MG/3ML Outcome: tolerated well, no immediate complications Procedure, treatment alternatives, risks and benefits explained, specific risks discussed. Consent was given by the patient. Immediately prior to procedure a time out was called to verify the correct patient, procedure, equipment, support staff and site/side marked as required. Patient was prepped and draped in the usual sterile fashion.

## 2021-08-16 ENCOUNTER — Other Ambulatory Visit: Payer: Self-pay | Admitting: Primary Care

## 2021-08-16 DIAGNOSIS — I10 Essential (primary) hypertension: Secondary | ICD-10-CM

## 2021-08-16 NOTE — Telephone Encounter (Signed)
Noted, refill sent to pharmacy.  Patient due for CPE in late November/early December.  Please schedule.

## 2021-08-16 NOTE — Telephone Encounter (Signed)
Lvm for patient to return call and schedule

## 2021-09-23 ENCOUNTER — Other Ambulatory Visit: Payer: Self-pay | Admitting: Family Medicine

## 2021-09-23 MED ORDER — MOLNUPIRAVIR EUA 200MG CAPSULE: 4.0000 | ORAL_CAPSULE | Freq: Two times a day (BID) | ORAL | 0 refills | Status: AC

## 2021-09-23 NOTE — Progress Notes (Signed)
On call note.  Covid positive on home test with URI sx but not SOB and <5 days of symptoms.  Rx sent for monupiravir.

## 2021-09-25 ENCOUNTER — Telehealth: Payer: Self-pay

## 2021-09-25 NOTE — Telephone Encounter (Signed)
I spoke with pt; pt said still head congestion and S/T that is now worse than when started on 09/22/21; if S/T does not improve pt will cb for appt. Pt said overall he is feeling better except for the S/T; pt is able to eat and drink and swallow OK. Pt still having some weakness but no difficulty breathing or H/A.Self quarantine, drink plenty of fluids, rest, and take Tylenol for fever. UC & ED precautions given and pt voiced understanding. Sending note to DR Para March and Shanda Bumps CMA.

## 2021-09-25 NOTE — Telephone Encounter (Signed)
[facility] Night - Client TELEPHONE ADVICE RECORD [employer] Patient Name: Noah Wright Gender: Male DOB: November 12, 1965 Age: 56 Y 7 M 13 D Return Phone Number: [phone] (Primary), [phone] (Secondary) Address: City/ State/ Zip: [place] [address] Client [facility] Night - Client Client Site [facility] - Night Provider Vernona Rieger - NP Contact Type Call Who Is Calling Patient / Member / Family / Caregiver Call Type Triage / Clinical Relationship To Patient Self Return Phone Number [phone] (Primary) Chief Complaint Headache Reason for Call Symptomatic / Request for Health Information Initial Comment Caller states he tested positive for covid last night and he is high risk and need to know what he needs to do to get medication immediately. Caller states he has COPD. Caller states he has sore throat, weakness, fever,cough, headaches and body aches. Translation No Nurse Assessment Nurse: Stefano Gaul, RN, Dwana Curd Date/Time (Eastern Time): 09/23/2021 8:25:39 AM Please select the assessment type ---Verbal order / New medication order Additional Documentation ---Caller wants on call provider paged for paxlovid. Does the client directives allow for assistance with medications after hours? ---Yes Other current medications? ---Yes List current medications. ---HCTZ; albuterol inhaler; rovastatin; Medication allergies? ---No Pharmacy name and phone number. ---[facility] [address]; [place]; phone: [phone] Does the client directive allow for RN to call in the medication order to the pharmacy? ---Yes Additional Documentation ---advised caller that on call provider wil be paged regarding medication. Verbalized understanding Nurse: Stefano Gaul, RN, Dwana Curd Date/Time Lamount Cohen Time): 09/23/2021 8:17:08 AM Confirm and document reason for call. If symptomatic, describe symptoms. ---Caller states  he tested positive for COVID last night. thinks he has a fever. No thermometer. coughing body aches, nasal congestion, sore throat, headache. slight wheezing earlier this am. symptoms started Thursday evening. oxygen level 98%. Does the patient have any new or worsening symptoms? ---Yes PLEASE NOTE: All timestamps contained within this report are represented as [place] Standard Time. CONFIDENTIALTY NOTICE: This fax transmission is intended only for the addressee. It contains information that is legally privileged, confidential or otherwise protected from use or disclosure. If you are not the intended recipient, you are strictly prohibited from reviewing, disclosing, copying using or disseminating any of this information or taking any action in reliance on or regarding this information. If you have received this fax in error, please notify us immediately by telephone so that we can arrange for its return to [place]. Phone: [phone], Toll-Free: [phone], Fax: [phone] Page: 2 of 3 Call Id: [id number] Nurse Assessment Will a triage be completed? ---Yes Related visit to physician within the last 2 weeks? ---No Does the PT have any chronic conditions? (i.e. diabetes, asthma, this includes High risk factors for pregnancy, etc.) ---Yes List chronic conditions. ---COPD; HTN Is this a behavioral health or substance abuse call? ---No Guidelines Guideline Title Affirmed Question Affirmed Notes Nurse Date/Time (Eastern Time) COVID-19 - Diagnosed or Suspected [1] HIGH RISK patient (e.g., weak immune system, age > 64 years, obesity with BMI 30 or higher, pregnant, chronic lung disease or other chronic medical condition) AND [2] COVID symptoms (e.g., cough, fever) (Exceptions: Already seen by PCP and no new or worsening symptoms.) Stefano Gaul, RN, Dwana Curd 09/23/2021 8:21:27 AM Disp. Time Lamount Cohen Time) Disposition Final User 09/23/2021 8:37:02 AM Call PCP within 24 Hours Yes Wallace Going 09/23/2021 8:38:15 AM Called On-Call Provider Stefano Gaul, RN, Dwana Curd Final Disposition 09/23/2021 8:37:02 AM Call PCP within 24 Hours Yes Stefano Gaul, RN, Clerance Lav Disagree/Comply  Comply Caller Understands Yes PreDisposition Home Care Care Advice Given Per Guideline CALL PCP WITHIN 24 HOURS: * You need to discuss this with your doctor (or NP/PA) within the next 24 hours. * Cough: Use cough drops. * Feeling dehydrated: Drink extra liquids. If the air in your home is dry, use a humidifier. GENERAL CARE ADVICE FOR COVID-19 SYMPTOMS: * HOME REMEDY - HONEY: This old home remedy has been shown to help decrease coughing PLEASE NOTE: All timestamps contained within this report are represented as [place] Standard Time. CONFIDENTIALTY NOTICE: This fax transmission is intended only for the addressee. It contains information that is legally privileged, confidential or otherwise protected from use or disclosure. If you are not the intended recipient, you are strictly prohibited from reviewing, disclosing, copying using or disseminating any of this information or taking any action in reliance on or regarding this information. If you have received this fax in error, please notify us immediately by telephone so that we can arrange for its return to [place]. Phone: [phone], Toll-Free: [phone], Fax: [phone] Page: 3 of 3 Call Id: [id number] Care Advice Given Per Guideline at night. The adult dosage is 2 teaspoons (10 ml) at bedtime. * Sore throat: Try throat lozenges, hard candy or warm chicken broth. CALL BACK IF: * You become worse CARE ADVICE given per COVID-19 - DIAGNOSED OR SUSPECTED (Adult) guideline. Paging DoctorName Phone DateTime Result/ Outcome Message Type Notes Raechel Ache - MD [phone] 09/23/2021 8:38:15 AM Called On Call Provider - Reached Doctor Paged Raechel Ache - MD 09/23/2021 8:46:20 AM Spoke with On Call - General Message Result Called Dr. Raechel Ache on his cell phone  and gave report that pt tested positive for COVID last night. thinks he has a fever. No thermometer. coughing body aches, nasal congestion, sore throat, headache. slight wheezing earlier this am. symptoms started Thursday evening. oxygen level 98%. Pt is requestiing paxlovid as he has COPD. States he will send in some monupiravir 4 pills BID x 4 days. pt notified and verbalized understandin

## 2021-09-25 NOTE — Telephone Encounter (Signed)
Noted. Thanks.

## 2021-10-04 ENCOUNTER — Other Ambulatory Visit: Payer: Self-pay

## 2021-10-04 ENCOUNTER — Telehealth: Payer: Self-pay | Admitting: Acute Care

## 2021-10-04 DIAGNOSIS — F17210 Nicotine dependence, cigarettes, uncomplicated: Secondary | ICD-10-CM

## 2021-10-04 DIAGNOSIS — Z122 Encounter for screening for malignant neoplasm of respiratory organs: Secondary | ICD-10-CM

## 2021-10-04 DIAGNOSIS — Z87891 Personal history of nicotine dependence: Secondary | ICD-10-CM

## 2021-10-04 NOTE — Telephone Encounter (Signed)
LDCT order placed for May 2024.

## 2021-10-04 NOTE — Telephone Encounter (Signed)
I have called the patient with the results of his CT Chest that was done in 06/2021. He was already aware of the resukts, but I wanted to discuss them with him. This was a 3 month follow up to a LR 3 scan that was done 02/2021. 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 Recommend return to annual lung cancer screening CT. I told Noah Wright that we will have his next screening CT in 05/2022 per the radiology recommendation. He verbalized understanding, and is in agreement with the next scan 05/2022. Langley Gauss, please place order for annual screening scan 05/2022/ Thanks so much

## 2021-11-08 ENCOUNTER — Telehealth: Payer: Self-pay | Admitting: Primary Care

## 2021-11-08 DIAGNOSIS — I10 Essential (primary) hypertension: Secondary | ICD-10-CM

## 2021-11-09 NOTE — Telephone Encounter (Signed)
[email or web address]

## 2021-11-09 NOTE — Telephone Encounter (Signed)
Patient is due for CPE/follow up in mid to late November, this will be required prior to any further refills.  Please schedule.

## 2021-12-20 ENCOUNTER — Encounter: Payer: Self-pay | Admitting: Primary Care

## 2021-12-20 ENCOUNTER — Ambulatory Visit (INDEPENDENT_AMBULATORY_CARE_PROVIDER_SITE_OTHER): Payer: Managed Care, Other (non HMO) | Admitting: Primary Care

## 2021-12-20 VITALS — BP 130/86 | HR 62 | Temp 98.4°F | Ht 71.0 in | Wt 227.0 lb

## 2021-12-20 DIAGNOSIS — I2510 Atherosclerotic heart disease of native coronary artery without angina pectoris: Secondary | ICD-10-CM

## 2021-12-20 DIAGNOSIS — M25561 Pain in right knee: Secondary | ICD-10-CM

## 2021-12-20 DIAGNOSIS — Z0000 Encounter for general adult medical examination without abnormal findings: Secondary | ICD-10-CM

## 2021-12-20 DIAGNOSIS — G8929 Other chronic pain: Secondary | ICD-10-CM

## 2021-12-20 DIAGNOSIS — Z23 Encounter for immunization: Secondary | ICD-10-CM | POA: Diagnosis not present

## 2021-12-20 DIAGNOSIS — R7303 Prediabetes: Secondary | ICD-10-CM

## 2021-12-20 DIAGNOSIS — Z125 Encounter for screening for malignant neoplasm of prostate: Secondary | ICD-10-CM

## 2021-12-20 DIAGNOSIS — M25562 Pain in left knee: Secondary | ICD-10-CM

## 2021-12-20 DIAGNOSIS — F17200 Nicotine dependence, unspecified, uncomplicated: Secondary | ICD-10-CM

## 2021-12-20 DIAGNOSIS — E785 Hyperlipidemia, unspecified: Secondary | ICD-10-CM | POA: Diagnosis not present

## 2021-12-20 DIAGNOSIS — J439 Emphysema, unspecified: Secondary | ICD-10-CM

## 2021-12-20 DIAGNOSIS — I10 Essential (primary) hypertension: Secondary | ICD-10-CM | POA: Diagnosis not present

## 2021-12-20 LAB — LIPID PANEL
Cholesterol: 222 — AB (ref 0–200)
HDL: 39 (ref 35–70)
LDL Cholesterol: 161
Triglycerides: 123 (ref 40–160)

## 2021-12-20 LAB — BASIC METABOLIC PANEL
BUN: 15 (ref 4–21)
CO2: 27 — AB (ref 13–22)
Chloride: 101 (ref 99–108)
Creatinine: 1.3 (ref 0.6–1.3)
Glucose: 96
Potassium: 4.2 mEq/L (ref 3.5–5.1)
Sodium: 141 (ref 137–147)

## 2021-12-20 LAB — COMPREHENSIVE METABOLIC PANEL
Albumin: 4.5 (ref 3.5–5.0)
Calcium: 9.2 (ref 8.7–10.7)
Globulin: 2.7
eGFR: 64

## 2021-12-20 LAB — HEPATIC FUNCTION PANEL
ALT: 21 U/L (ref 10–40)
AST: 23 (ref 14–40)
Alkaline Phosphatase: 65 (ref 25–125)
Bilirubin, Total: 0.5

## 2021-12-20 LAB — HEMOGLOBIN A1C: Hemoglobin A1C: 5.9

## 2021-12-20 NOTE — Assessment & Plan Note (Signed)
Chronic and continued. Following with orthopedics.

## 2021-12-20 NOTE — Assessment & Plan Note (Signed)
Controlled.  Continue HCTZ 25 mg daily. CMP pending. 

## 2021-12-20 NOTE — Assessment & Plan Note (Signed)
Commended him on quitting in May 2023.  Lung cancer screening UTD, due again in February 2024.

## 2021-12-20 NOTE — Addendum Note (Signed)
Addended by: Lonia Blood on: 12/20/2021 12:40 PM   Modules accepted: Orders

## 2021-12-20 NOTE — Assessment & Plan Note (Signed)
Controlled.  Infrequent use of albuterol inhaler. Continue albuterol inhaler PRN 

## 2021-12-20 NOTE — Assessment & Plan Note (Signed)
Second Shingrix and influenza vaccines due and provided today. PSA due. Colonoscopy UTD, due 2033.  Discussed the importance of a healthy diet and regular exercise in order for weight loss, and to reduce the risk of further co-morbidity.  Exam stable. Labs pending.  Follow up in 1 year for repeat physical.

## 2021-12-20 NOTE — Patient Instructions (Addendum)
Stop by the lab prior to leaving today. I will notify you of your results once received.   It was a pleasure to see you today!  Preventive Care 40-58 Years Old, Male Preventive care refers to lifestyle choices and visits with your health care provider that can promote health and wellness. Preventive care visits are also called wellness exams. What can I expect for my preventive care visit? Counseling During your preventive care visit, your health care provider may ask about your: Medical history, including: Past medical problems. Family medical history. Current health, including: Emotional well-being. Home life and relationship well-being. Sexual activity. Lifestyle, including: Alcohol, nicotine or tobacco, and drug use. Access to firearms. Diet, exercise, and sleep habits. Safety issues such as seatbelt and bike helmet use. Sunscreen use. Work and work environment. Physical exam Your health care provider will check your: Height and weight. These may be used to calculate your BMI (body mass index). BMI is a measurement that tells if you are at a healthy weight. Waist circumference. This measures the distance around your waistline. This measurement also tells if you are at a healthy weight and may help predict your risk of certain diseases, such as type 2 diabetes and high blood pressure. Heart rate and blood pressure. Body temperature. Skin for abnormal spots. What immunizations do I need?  Vaccines are usually given at various ages, according to a schedule. Your health care provider will recommend vaccines for you based on your age, medical history, and lifestyle or other factors, such as travel or where you work. What tests do I need? Screening Your health care provider may recommend screening tests for certain conditions. This may include: Lipid and cholesterol levels. Diabetes screening. This is done by checking your blood sugar (glucose) after you have not eaten for a while  (fasting). Hepatitis B test. Hepatitis C test. HIV (human immunodeficiency virus) test. STI (sexually transmitted infection) testing, if you are at risk. Lung cancer screening. Prostate cancer screening. Colorectal cancer screening. Talk with your health care provider about your test results, treatment options, and if necessary, the need for more tests. Follow these instructions at home: Eating and drinking  Eat a diet that includes fresh fruits and vegetables, whole grains, lean protein, and low-fat dairy products. Take vitamin and mineral supplements as recommended by your health care provider. Do not drink alcohol if your health care provider tells you not to drink. If you drink alcohol: Limit how much you have to 0-2 drinks a day. Know how much alcohol is in your drink. In the [place] one drink equals one 12 oz bottle of beer (355 mL), one 5 oz glass of wine (148 mL), or one 1 oz glass of hard liquor (44 mL). Lifestyle Brush your teeth every morning and night with fluoride toothpaste. Floss one time each day. Exercise for at least 30 minutes 5 or more days each week. Do not use any products that contain nicotine or tobacco. These products include cigarettes, chewing tobacco, and vaping devices, such as e-cigarettes. If you need help quitting, ask your health care provider. Do not use drugs. If you are sexually active, practice safe sex. Use a condom or other form of protection to prevent STIs. Take aspirin only as told by your health care provider. Make sure that you understand how much to take and what form to take. Work with your health care provider to find out whether it is safe and beneficial for you to take aspirin daily. Find healthy ways to manage   stress, such as: Meditation, yoga, or listening to music. Journaling. Talking to a trusted person. Spending time with friends and family. Minimize exposure to UV radiation to reduce your risk of skin cancer. Safety Always wear  your seat belt while driving or riding in a vehicle. Do not drive: If you have been drinking alcohol. Do not ride with someone who has been drinking. When you are tired or distracted. While texting. If you have been using any mind-altering substances or drugs. Wear a helmet and other protective equipment during sports activities. If you have firearms in your house, make sure you follow all gun safety procedures. What's next? Go to your health care provider once a year for an annual wellness visit. Ask your health care provider how often you should have your eyes and teeth checked. Stay up to date on all vaccines. This information is not intended to replace advice given to you by your health care provider. Make sure you discuss any questions you have with your health care provider. Document Revised: 06/29/2020 Document Reviewed: 06/29/2020 Elsevier Patient Education  2023 Elsevier Inc.  

## 2021-12-20 NOTE — Addendum Note (Signed)
Addended by: Donnamarie Poag on: 12/20/2021 12:36 PM   Modules accepted: Orders

## 2021-12-20 NOTE — Assessment & Plan Note (Addendum)
He's not taken rosuvastatin 20 mg in months. Discussed CT chest results from 2023 which reveal CAD.  Repeat lipid panel pending today. Will resume statin therapy.

## 2021-12-20 NOTE — Assessment & Plan Note (Signed)
Discussed CT chest results from February and May 2023 which reveal coronary calcification.  Will resume statin therapy. Await lipid panel results.  Commended him on quitting smoking.

## 2021-12-20 NOTE — Assessment & Plan Note (Signed)
Repeat A1C pending.  Discussed the importance of a healthy diet and regular exercise in order for weight loss, and to reduce the risk of further co-morbidity.  

## 2021-12-20 NOTE — Progress Notes (Signed)
Subjective:    Patient ID: Noah Wright, male    DOB: 03-12-1965, 56 y.o.   MRN: [id number]  HPI  Noah Wright is a very pleasant 56 y.o. male who presents today for complete physical and follow up of chronic conditions.  Immunizations: -Tetanus: 2020 -Influenza: Completed today -Shingles: Completed 1 dose in 2022  Diet: Fair diet.  Exercise: No regular exercise. Active at work and home.  Eye exam: Completed years ago. Completes through DOT physical  Dental exam: Completes semi-annually   Colonoscopy: Completed in 2023, due 2033 Lung Cancer Screening: Completed in February 2023 and May 2023  PSA: Due  BP Readings from Last 3 Encounters:  12/20/21 130/86  02/02/21 121/82  11/30/20 118/76         Review of Systems  Constitutional:  Negative for unexpected weight change.  HENT:  Negative for rhinorrhea.   Respiratory:  Negative for cough and shortness of breath.   Cardiovascular:  Negative for chest pain.  Gastrointestinal:  Negative for constipation and diarrhea.  Genitourinary:  Negative for difficulty urinating.  Musculoskeletal:  Positive for arthralgias.  Skin:  Negative for rash.  Allergic/Immunologic: Negative for environmental allergies.  Neurological:  Negative for dizziness and headaches.  Psychiatric/Behavioral:  The patient is not nervous/anxious.          Past Medical History:  Diagnosis Date   Asthma    Diverticulitis    GERD (gastroesophageal reflux disease)    Herpes zoster without complication 10/01/2018   Hyperlipidemia    Hypertension    Migraines    Shingles    Smoker 06/27/2012    Social History   Socioeconomic History   Marital status: Married    Spouse name: Not on file   Number of children: Not on file   Years of education: Not on file   Highest education level: Not on file  Occupational History   Not on file  Tobacco Use   Smoking status: Former    Packs/day: 0.50    Years: 43.00    Total pack years: 21.50     Types: Cigarettes    Quit date: 05/15/2021    Years since quitting: 0.6   Smokeless tobacco: Current    Types: Chew  Substance and Sexual Activity   Alcohol use: No   Drug use: No   Sexual activity: Not on file  Other Topics Concern   Not on file  Social History Narrative   Married.   3 children.   Works in [employer] se[employer].   Enjoys spending time outdoors.   Social Determinants of Health   Financial Resource Strain: Not on file  Food Insecurity: Not on file  Transportation Needs: Not on file  Physical Activity: Not on file  Stress: Not on file  Social Connections: Not on file  Intimate Partner Violence: Not on file    Past Surgical History:  Procedure Laterality Date   COLONOSCOPY WITH PROPOFOL N/A 02/02/2021   Procedure: COLONOSCOPY WITH PROPOFOL;  Surgeon: Midge Minium, MD;  [facility];  Service: Endoscopy;  Laterality: N/A;   DG GALL BLADDER  1981    Family History  Problem Relation Age of Onset   Arthritis Mother    Hearing loss Mother    Heart disease Mother    Hypertension Father    Alcohol abuse Father    Cancer Father        bladder/kidney   Hearing loss Father    Hyperlipidemia Father     Allergies  Allergen  Reactions   Lipitor [Atorvastatin] Other (See Comments)    Myalgias, weakness (20 mg)    Current Outpatient Medications on File Prior to Visit  Medication Sig Dispense Refill   hydrochlorothiazide (HYDRODIURIL) 25 MG tablet Take 1 tablet (25 mg total) by mouth daily. for blood pressure. Office visit required for further refills. 90 tablet 0   albuterol (VENTOLIN HFA) 108 (90 Base) MCG/ACT inhaler USE 2 INHALATIONS BY MOUTH INTO  THE LUNGS EVERY 6 HOURS AS  NEEDED FOR WHEEZING OR SHORTNESS OF BREATH (Patient not taking: Reported on 12/20/2021) 17 g 0   rosuvastatin (CRESTOR) 20 MG tablet Take 1 tablet (20 mg total) by mouth every evening. for cholesterol. (Patient not taking: Reported on 12/20/2021) 90 tablet 3   No current facility-administered  medications on file prior to visit.    BP 130/86   Pulse 62   Temp 98.4 F (36.9 C) (Temporal)   Ht 5\' 11"  (1.803 m)   Wt 227 lb (103 kg)   SpO2 99%   BMI 31.66 kg/m  Objective:   Physical Exam HENT:     Right Ear: Tympanic membrane and ear canal normal.     Left Ear: Tympanic membrane and ear canal normal.     Nose: Nose normal.     Right Sinus: No maxillary sinus tenderness or frontal sinus tenderness.     Left Sinus: No maxillary sinus tenderness or frontal sinus tenderness.  Eyes:     Conjunctiva/sclera: Conjunctivae normal.  Neck:     Thyroid: No thyromegaly.     Vascular: No carotid bruit.  Cardiovascular:     Rate and Rhythm: Normal rate and regular rhythm.     Heart sounds: Normal heart sounds.  Pulmonary:     Effort: Pulmonary effort is normal.     Breath sounds: Normal breath sounds. No wheezing or rales.  Abdominal:     General: Bowel sounds are normal.     Palpations: Abdomen is soft.     Tenderness: There is no abdominal tenderness.  Musculoskeletal:        General: Normal Wright of motion.     Cervical back: Neck supple.  Skin:    General: Skin is warm and dry.  Neurological:     Mental Status: He is alert and oriented to person, place, and time.     Cranial Nerves: No cranial nerve deficit.     Deep Tendon Reflexes: Reflexes are normal and symmetric.  Psychiatric:        Mood and Affect: Mood normal.           Assessment & Plan:   Problem List Items Addressed This Visit       Cardiovascular and Mediastinum   Essential hypertension    Controlled.  Continue HCTZ 25 mg daily. CMP pending.       CAD (coronary artery disease)    Discussed CT chest results from February and May 2023 which reveal coronary calcification.  Will resume statin therapy. Await lipid panel results.  Commended him on quitting smoking.        Respiratory   Pulmonary emphysema (HCC)    Controlled.  Infrequent use of albuterol inhaler.  Continue albuterol  inhaler PRN.        Other   Tobacco dependence    Commended him on quitting in May 2023.  Lung cancer screening UTD, due again in February 2024.      Preventative health care - Primary    Second Shingrix and influenza vaccines  due and provided today. PSA due. Colonoscopy UTD, due 2033.  Discussed the importance of a healthy diet and regular exercise in order for weight loss, and to reduce the risk of further co-morbidity.  Exam stable. Labs pending.  Follow up in 1 year for repeat physical.       Hyperlipidemia    He's not taken rosuvastatin 20 mg in months. Discussed CT chest results from 2023 which reveal CAD.  Repeat lipid panel pending today. Will resume statin therapy.      Relevant Orders   Comprehensive metabolic panel   Lipid panel   Prediabetes    Repeat A1C pending.  Discussed the importance of a healthy diet and regular exercise in order for weight loss, and to reduce the risk of further co-morbidity.       Relevant Orders   Hemoglobin A1c   Chronic pain of both knees    Chronic and continued. Following with orthopedics.        Other Visit Diagnoses     Screening for prostate cancer       Relevant Orders   PSA          Doreene Nest, NP

## 2021-12-21 ENCOUNTER — Ambulatory Visit (INDEPENDENT_AMBULATORY_CARE_PROVIDER_SITE_OTHER): Payer: Managed Care, Other (non HMO)

## 2021-12-21 ENCOUNTER — Ambulatory Visit: Payer: Managed Care, Other (non HMO) | Admitting: Orthopedic Surgery

## 2021-12-21 ENCOUNTER — Encounter: Payer: Self-pay | Admitting: Orthopedic Surgery

## 2021-12-21 ENCOUNTER — Ambulatory Visit: Payer: Self-pay

## 2021-12-21 DIAGNOSIS — M1711 Unilateral primary osteoarthritis, right knee: Secondary | ICD-10-CM | POA: Diagnosis not present

## 2021-12-21 DIAGNOSIS — M170 Bilateral primary osteoarthritis of knee: Secondary | ICD-10-CM

## 2021-12-21 DIAGNOSIS — M1712 Unilateral primary osteoarthritis, left knee: Secondary | ICD-10-CM

## 2021-12-21 NOTE — Progress Notes (Signed)
Office Visit Note   Patient: Noah Wright           Date of Birth: 10-27-1965           MRN: [id number] Visit Date: 12/21/2021 Requested by: Doreene Nest, NP [address] [place],  [place] [address] PCP: Doreene Nest, NP  Subjective: Chief Complaint  Patient presents with   Right Knee - Pain   Left Knee - Pain    HPI: Noah Wright is a 56 y.o. male who presents to the office reporting bilateral knee pain.  In general Noah Wright is very functional with his knees.  He has not obtained sustained relief from the cortisone and gel injections.  Several weeks ago he was having difficulty weightbearing on the left knee but that resolved.  He does do significant high demand work activities involving home projects.  He does have some level of discomfort with this but he is able to function.  After he is on long walks he has soreness in both knees but the left has been a little bit worse the following day compared to the right..                ROS: All systems reviewed are negative as they relate to the chief complaint within the history of present illness.  Patient denies fevers or chills.  Assessment & Plan: Visit Diagnoses:  1. Primary osteoarthritis of knees, bilateral     Plan: Impression is bilateral knee arthritis which is minimally progressed compared to prior radiographs.  Has an effusion in the left knee but no effusion in the right knee.  Varus alignment present.  Has excellent range of motion and excellent quad strength.  Realistically there is no predictable intervention outside of knee replacement which is going to give him predictable pain relief.  However, based on his current level of function I do not think it is really indicated at this time.  I do think that the cortisone injection could help him with any flareups that he has try to get him back to his baseline amount of discomfort.  Knee replacement likely coming for Noah Wright sometime in the future but for now he looks very  functional and the problem looks manageable for now.  Follow-up as needed.  Follow-Up Instructions: No follow-ups on file.   Orders:  Orders Placed This Encounter  Procedures   XR KNEE 3 VIEW LEFT   XR Knee 1-2 Views Right   No orders of the defined types were placed in this encounter.     Procedures: No procedures performed   Clinical Data: No additional findings.  Objective: Vital Signs: There were no vitals taken for this visit.  Physical Exam:  Constitutional: Patient appears well-developed HEENT:  Head: Normocephalic Eyes:EOM are normal Neck: Normal range of motion Cardiovascular: Normal rate Pulmonary/chest: Effort normal Neurologic: Patient is alert Skin: Skin is warm Psychiatric: Patient has normal mood and affect  Ortho Exam: Ortho exam demonstrates mild varus alignment slightly worse on the right compared to the left.  Right has no knee effusion left has a mild knee effusion.  Range of motion is actually about 5 degrees of hyperextension bilaterally to full flexion.  Collateral and cruciate ligaments are stable.  Extensor mechanism intact.  Pedal pulses palpable.  No Baker's cyst or masses palpable in the right or left knee region.  No groin pain with internal or external rotation of either leg.  Specialty Comments:  No specialty comments available.  Imaging: XR KNEE 3 VIEW LEFT  Result Date: 12/21/2021 AP lateral merchant radiographs left knee reviewed.  Severe end-stage tricompartmental arthritis is present worse in the medial compartment with bone-on-bone changes.  Patellofemoral and lateral compartments less affected.  Mild varus alignment present.  No acute fracture.  XR Knee 1-2 Views Right  Result Date: 12/21/2021 AP lateral merchant radiographs right knee reviewed.  Varus alignment is present.  Severe end-stage tricompartmental arthritis is present worse in the medial compartment less affected in the patellofemoral and lateral compartments.  No acute  fracture.  Bone quality appears excellent.    PMFS History: Patient Active Problem List   Diagnosis Date Noted   CAD (coronary artery disease) 12/20/2021   Colon cancer screening    Chronic pain of both knees 11/30/2020   Hyperlipidemia 07/31/2019   Prediabetes 07/31/2019   Preventative health care 03/24/2018   Pulmonary emphysema (HCC) 12/24/2017   Bronchiectasis (HCC) 07/11/2017   Essential hypertension 09/14/2016   Obesity (BMI 30-39.9) 09/14/2016   Tobacco dependence 06/27/2012   Past Medical History:  Diagnosis Date   Asthma    Diverticulitis    GERD (gastroesophageal reflux disease)    Herpes zoster without complication 10/01/2018   Hyperlipidemia    Hypertension    Migraines    Shingles    Smoker 06/27/2012    Family History  Problem Relation Age of Onset   Arthritis Mother    Hearing loss Mother    Heart disease Mother    Hypertension Father    Alcohol abuse Father    Cancer Father        bladder/kidney   Hearing loss Father    Hyperlipidemia Father     Past Surgical History:  Procedure Laterality Date   COLONOSCOPY WITH PROPOFOL N/A 02/02/2021   Procedure: COLONOSCOPY WITH PROPOFOL;  Surgeon: Midge Minium, MD;  [facility] END[facility]COPY;  Service: Endoscopy;  Laterality: N/A;   DG GALL BLADDER  1981   Social History   Occupational History   Not on file  Tobacco Use   Smoking status: Former    Packs/day: 0.50    Years: 43.00    Total pack years: 21.50    Types: Cigarettes    Quit date: 05/15/2021    Years since quitting: 0.6   Smokeless tobacco: Current    Types: Chew  Substance and Sexual Activity   Alcohol use: No   Drug use: No   Sexual activity: Not on file

## 2021-12-22 ENCOUNTER — Other Ambulatory Visit: Payer: Self-pay | Admitting: Primary Care

## 2021-12-22 DIAGNOSIS — E785 Hyperlipidemia, unspecified: Secondary | ICD-10-CM

## 2021-12-22 LAB — COMPREHENSIVE METABOLIC PANEL
ALT: 21 IU/L (ref 0–44)
AST: 23 IU/L (ref 0–40)
Albumin/Globulin Ratio: 1.7 (ref 1.2–2.2)
Albumin: 4.5 g/dL (ref 3.8–4.9)
Alkaline Phosphatase: 65 IU/L (ref 44–121)
BUN/Creatinine Ratio: 12 (ref 9–20)
BUN: 15 mg/dL (ref 6–24)
Bilirubin Total: 0.5 mg/dL (ref 0.0–1.2)
CO2: 27 mmol/L (ref 20–29)
Calcium: 9.2 mg/dL (ref 8.7–10.2)
Chloride: 101 mmol/L (ref 96–106)
Creatinine, Ser: 1.29 mg/dL — ABNORMAL HIGH (ref 0.76–1.27)
Globulin, Total: 2.7 g/dL (ref 1.5–4.5)
Glucose: 96 mg/dL (ref 70–99)
Potassium: 4.2 mmol/L (ref 3.5–5.2)
Sodium: 141 mmol/L (ref 134–144)
Total Protein: 7.2 g/dL (ref 6.0–8.5)
eGFR: 64 mL/min/{1.73_m2} (ref >59)

## 2021-12-22 LAB — LIPID PANEL
Chol/HDL Ratio: 5.7 ratio — ABNORMAL HIGH (ref 0.0–5.0)
Cholesterol, Total: 222 mg/dL — ABNORMAL HIGH (ref 100–199)
HDL: 39 mg/dL — ABNORMAL LOW (ref >39)
LDL Chol Calc (NIH): 161 mg/dL — ABNORMAL HIGH (ref 0–99)
Triglycerides: 123 mg/dL (ref 0–149)
VLDL Cholesterol Cal: 22 mg/dL (ref 5–40)

## 2021-12-22 LAB — HEMOGLOBIN A1C
Est. average glucose Bld gHb Est-mCnc: 123 mg/dL
Hgb A1c MFr Bld: 5.9 % — ABNORMAL HIGH (ref 4.8–5.6)

## 2021-12-22 LAB — PSA: Prostate Specific Ag, Serum: 2 ng/mL (ref 0.0–4.0)

## 2021-12-22 MED ORDER — ROSUVASTATIN CALCIUM 20 MG PO TABS: 20.0000 mg | ORAL_TABLET | Freq: Every evening | ORAL | 3 refills | Status: DC

## 2021-12-27 ENCOUNTER — Encounter: Payer: Self-pay | Admitting: Primary Care

## 2022-01-16 ENCOUNTER — Other Ambulatory Visit: Payer: Self-pay | Admitting: Primary Care

## 2022-01-16 DIAGNOSIS — I10 Essential (primary) hypertension: Secondary | ICD-10-CM

## 2022-03-21 ENCOUNTER — Telehealth: Payer: Self-pay

## 2022-03-21 NOTE — Telephone Encounter (Signed)
Left VM for patient to call for scheduling annual LDCT due May 2024

## 2022-06-12 ENCOUNTER — Ambulatory Visit (HOSPITAL_COMMUNITY): Payer: Managed Care, Other (non HMO)

## 2022-06-18 ENCOUNTER — Ambulatory Visit (HOSPITAL_COMMUNITY): Payer: Managed Care, Other (non HMO)

## 2022-06-18 ENCOUNTER — Ambulatory Visit
Admission: RE | Admit: 2022-06-18 | Discharge: 2022-06-18 | Disposition: A | Discharge status: Home / Self Care | Payer: Managed Care, Other (non HMO) | Source: Ambulatory Visit | Attending: Acute Care | Admitting: Acute Care

## 2022-06-18 DIAGNOSIS — Z87891 Personal history of nicotine dependence: Secondary | ICD-10-CM

## 2022-06-18 DIAGNOSIS — Z122 Encounter for screening for malignant neoplasm of respiratory organs: Secondary | ICD-10-CM

## 2022-06-18 DIAGNOSIS — F17210 Nicotine dependence, cigarettes, uncomplicated: Secondary | ICD-10-CM

## 2022-06-26 ENCOUNTER — Other Ambulatory Visit: Payer: Self-pay | Admitting: Acute Care

## 2022-06-26 DIAGNOSIS — Z87891 Personal history of nicotine dependence: Secondary | ICD-10-CM

## 2022-06-26 DIAGNOSIS — Z122 Encounter for screening for malignant neoplasm of respiratory organs: Secondary | ICD-10-CM

## 2023-01-14 ENCOUNTER — Encounter: Payer: Self-pay | Admitting: Primary Care

## 2023-01-14 ENCOUNTER — Ambulatory Visit (INDEPENDENT_AMBULATORY_CARE_PROVIDER_SITE_OTHER): Payer: Managed Care, Other (non HMO) | Admitting: Primary Care

## 2023-01-14 VITALS — BP 132/84 | HR 56 | Temp 97.3°F | Ht 71.0 in | Wt 213.0 lb

## 2023-01-14 DIAGNOSIS — I2510 Atherosclerotic heart disease of native coronary artery without angina pectoris: Secondary | ICD-10-CM | POA: Diagnosis not present

## 2023-01-14 DIAGNOSIS — Z23 Encounter for immunization: Secondary | ICD-10-CM | POA: Diagnosis not present

## 2023-01-14 DIAGNOSIS — Z0000 Encounter for general adult medical examination without abnormal findings: Secondary | ICD-10-CM

## 2023-01-14 DIAGNOSIS — J439 Emphysema, unspecified: Secondary | ICD-10-CM

## 2023-01-14 DIAGNOSIS — G8929 Other chronic pain: Secondary | ICD-10-CM

## 2023-01-14 DIAGNOSIS — I10 Essential (primary) hypertension: Secondary | ICD-10-CM

## 2023-01-14 DIAGNOSIS — M25561 Pain in right knee: Secondary | ICD-10-CM

## 2023-01-14 DIAGNOSIS — F17200 Nicotine dependence, unspecified, uncomplicated: Secondary | ICD-10-CM

## 2023-01-14 DIAGNOSIS — M546 Pain in thoracic spine: Secondary | ICD-10-CM | POA: Insufficient documentation

## 2023-01-14 DIAGNOSIS — E785 Hyperlipidemia, unspecified: Secondary | ICD-10-CM

## 2023-01-14 DIAGNOSIS — R7303 Prediabetes: Secondary | ICD-10-CM

## 2023-01-14 DIAGNOSIS — M25562 Pain in left knee: Secondary | ICD-10-CM

## 2023-01-14 DIAGNOSIS — Z125 Encounter for screening for malignant neoplasm of prostate: Secondary | ICD-10-CM

## 2023-01-14 NOTE — Assessment & Plan Note (Signed)
Stable.  Continue hydrochlorothiazide 25 mg daily. CMP pending.

## 2023-01-14 NOTE — Progress Notes (Signed)
Subjective:    Patient ID: Noah Wright, male    DOB: [birth year], 57 y.o.   MRN: [id number]  Back Pain Associated symptoms include headaches. Pertinent negatives include no chest pain or numbness.    Noah Wright is a very pleasant 57 y.o. male who presents today for complete physical and follow up of chronic conditions.  He would also like to discuss right lower thoracic back pain. Chronic for the last few years, located to the right lower thoracic back below the last posterior rib. He can produce pain with palpation but not movement. He denies hip pain, left sided back pain, numbness/tingling, loss of bowel/bladder control. He does not take Ibuprofen/Tylenol for symptoms as they last for a few seconds. He does take BC powder 2 times monthly on average for headaches. Pain improves by sticking a soft object behind his right back. He works in a physically demanding job, under houses, Therapist, music.  He underwent CT chest in June 2024 which showed no acute osseous abnormality, normal abdominal exam.  History of cholecystectomy.  Immunizations: -Tetanus: Completed in 2020 -Influenza: Influenza vaccine provided today.  -Shingles: Completed Shingrix series  Diet: Fair diet.  Exercise: Active.  Eye exam: Completes annually  Dental exam: Completes semi-annually    Colonoscopy: Completed in 2023, due 2033 Lung Cancer Screening: Completed in June 2024  PSA: Due  BP Readings from Last 3 Encounters:  01/14/23 132/84  12/20/21 130/86  02/02/21 121/82         Review of Systems  Constitutional:  Negative for unexpected weight change.  HENT:  Negative for rhinorrhea.   Respiratory:  Negative for cough and shortness of breath.   Cardiovascular:  Negative for chest pain.  Gastrointestinal:  Negative for constipation and diarrhea.  Genitourinary:  Negative for difficulty urinating.  Musculoskeletal:  Positive for back pain. Negative for arthralgias and myalgias.  Skin:  Negative  for rash.  Allergic/Immunologic: Negative for environmental allergies.  Neurological:  Positive for headaches. Negative for dizziness and numbness.  Psychiatric/Behavioral:  The patient is not nervous/anxious.          Past Medical History:  Diagnosis Date   Asthma    Diverticulitis    GERD (gastroesophageal reflux disease)    Herpes zoster without complication 10/01/2018   Hyperlipidemia    Hypertension    Migraines    Shingles    Smoker 06/27/2012    Social History   Socioeconomic History   Marital status: Married    Spouse name: Not on file   Number of children: Not on file   Years of education: Not on file   Highest education level: Not on file  Occupational History   Not on file  Tobacco Use   Smoking status: Former    Current packs/day: 0.00    Average packs/day: 0.5 packs/day for 43.0 years (21.5 ttl pk-yrs)    Types: Cigarettes    Start date: 05/16/1978    Quit date: 05/15/2021    Years since quitting: 1.6   Smokeless tobacco: Current    Types: Chew  Substance and Sexual Activity   Alcohol use: No   Drug use: No   Sexual activity: Not on file  Other Topics Concern   Not on file  Social History Narrative   Married.   3 children.   Works in Field seismologist.   Enjoys spending time outdoors.   Social Drivers of Corporate investment banker Strain: Not on file  Food Insecurity: Not on file  Transportation Needs: Not on file  Physical Activity: Not on file  Stress: Not on file  Social Connections: Not on file  Intimate Partner Violence: Not on file    Past Surgical History:  Procedure Laterality Date   COLONOSCOPY WITH PROPOFOL N/A 02/02/2021   Procedure: COLONOSCOPY WITH PROPOFOL;  Surgeon: Midge Minium, MD;  Location: [facility] ENDOSCOPY;  Service: Endoscopy;  Laterality: N/A;   DG GALL BLADDER  1981    Family History  Problem Relation Age of Onset   Arthritis Mother    Hearing loss Mother    Heart disease Mother    Hypertension Father    Alcohol abuse  Father    Cancer Father        bladder/kidney   Hearing loss Father    Hyperlipidemia Father     Allergies  Allergen Reactions   Lipitor [Atorvastatin] Other (See Comments)    Myalgias, weakness (20 mg)    Current Outpatient Medications on File Prior to Visit  Medication Sig Dispense Refill   hydrochlorothiazide (HYDRODIURIL) 25 MG tablet TAKE 1 TABLET BY MOUTH DAILY FOR BLOOD PRESSURE 90 tablet 2   albuterol (VENTOLIN HFA) 108 (90 Base) MCG/ACT inhaler USE 2 INHALATIONS BY MOUTH INTO  THE LUNGS EVERY 6 HOURS AS  NEEDED FOR WHEEZING OR SHORTNESS OF BREATH (Patient not taking: Reported on 01/14/2023) 17 g 0   rosuvastatin (CRESTOR) 20 MG tablet Take 1 tablet (20 mg total) by mouth every evening. for cholesterol. (Patient not taking: Reported on 01/14/2023) 90 tablet 3   No current facility-administered medications on file prior to visit.    BP 132/84   Pulse (!) 56   Temp (!) 97.3 F (36.3 C) (Temporal)   Ht 5\' 11"  (1.803 m)   Wt 213 lb (96.6 kg)   SpO2 98%   BMI 29.71 kg/m  Objective:   Physical Exam HENT:     Right Ear: Tympanic membrane and ear canal normal.     Left Ear: Tympanic membrane and ear canal normal.  Eyes:     Pupils: Pupils are equal, round, and reactive to light.  Cardiovascular:     Rate and Rhythm: Normal rate and regular rhythm.  Pulmonary:     Effort: Pulmonary effort is normal.     Breath sounds: Normal breath sounds.  Abdominal:     General: Bowel sounds are normal.     Palpations: Abdomen is soft.     Tenderness: There is no abdominal tenderness.  Musculoskeletal:        General: Normal range of motion.     Cervical back: Neck supple.  Skin:    General: Skin is warm and dry.  Neurological:     Mental Status: He is alert and oriented to person, place, and time.     Cranial Nerves: No cranial nerve deficit.     Deep Tendon Reflexes:     Reflex Scores:      Patellar reflexes are 2+ on the right side and 2+ on the left side. Psychiatric:         Mood and Affect: Mood normal.           Assessment & Plan:  Preventative health care Assessment & Plan: Immunizations UTD. Influenza vaccine provided today.  Colonoscopy UTD, due 2033 PSA due and pending.  Discussed the importance of a healthy diet and regular exercise in order for weight loss, and to reduce the risk of further co-morbidity.  Exam stable. Labs pending.  Follow up in 1 year  for repeat physical.    Coronary artery disease involving native heart without angina pectoris, unspecified vessel or lesion type Assessment & Plan: Asymptomatic.   Repeat lipid panel pending. Strongly advised he resume rosuvastatin 20 mg daily.     Essential hypertension Assessment & Plan: Stable.  Continue hydrochlorothiazide 25 mg daily. CMP pending.  Orders: -     CBC -     Comprehensive metabolic panel  Pulmonary emphysema, unspecified emphysema type (HCC) Assessment & Plan: Controlled.  Continue albuterol inhaler PRN.   Hyperlipidemia, unspecified hyperlipidemia type Assessment & Plan: Repeat lipid panel pending. He has not taken rosuvastatin in months.   Strongly advised he resume.   Orders: -     Lipid panel  Prediabetes Assessment & Plan: Repeat A1C pending.  Discussed the importance of a healthy diet and regular exercise in order for weight loss, and to reduce the risk of further co-morbidity.   Orders: -     Hemoglobin A1c  Tobacco dependence Assessment & Plan: Remains in cessation. Continue lung cancer screening.  Reviewed CT chest from June 2024.   Chronic right-sided thoracic back pain Assessment & Plan: Exam today without alarm signs. Reviewed CT chest from June 2024 which was without abnormality.  Repeat CT chest pending for February 2025. He declines physical therapy.   Chronic pain of both knees Assessment & Plan: Improved.  Continue to monitor.    Screening for prostate cancer -     PSA        Doreene Nest, NP

## 2023-01-14 NOTE — Assessment & Plan Note (Signed)
Controlled.   Continue albuterol inhaler PRN. 

## 2023-01-14 NOTE — Assessment & Plan Note (Signed)
Improved. Continue to monitor. 

## 2023-01-14 NOTE — Assessment & Plan Note (Signed)
Exam today without alarm signs. Reviewed CT chest from June 2024 which was without abnormality.  Repeat CT chest pending for February 2025. He declines physical therapy.

## 2023-01-14 NOTE — Assessment & Plan Note (Signed)
Immunizations UTD. Influenza vaccine provided today.  Colonoscopy UTD, due 2033 PSA due and pending.  Discussed the importance of a healthy diet and regular exercise in order for weight loss, and to reduce the risk of further co-morbidity.  Exam stable. Labs pending.  Follow up in 1 year for repeat physical.

## 2023-01-14 NOTE — Assessment & Plan Note (Addendum)
Asymptomatic.   Repeat lipid panel pending. Strongly advised he resume rosuvastatin 20 mg daily.

## 2023-01-14 NOTE — Assessment & Plan Note (Signed)
Repeat lipid panel pending. He has not taken rosuvastatin in months.   Strongly advised he resume.

## 2023-01-14 NOTE — Addendum Note (Signed)
Addended by: Lovena Neighbours on: 01/14/2023 08:52 AM   Modules accepted: Orders

## 2023-01-14 NOTE — Assessment & Plan Note (Signed)
Repeat A1C pending.  Discussed the importance of a healthy diet and regular exercise in order for weight loss, and to reduce the risk of further co-morbidity.  

## 2023-01-14 NOTE — Assessment & Plan Note (Signed)
Remains in cessation. Continue lung cancer screening.  Reviewed CT chest from June 2024.

## 2023-01-14 NOTE — Patient Instructions (Signed)
Stop by the lab prior to leaving today. I will notify you of your results once received.   It was a pleasure to see you today!  

## 2023-01-15 LAB — PSA: Prostate Specific Ag, Serum: 2.5 ng/mL (ref 0.0–4.0)

## 2023-01-15 LAB — HEMOGLOBIN A1C
Est. average glucose Bld gHb Est-mCnc: 120 mg/dL
Hgb A1c MFr Bld: 5.8 % — ABNORMAL HIGH (ref 4.8–5.6)

## 2023-01-15 LAB — LIPID PANEL
Chol/HDL Ratio: 4.2 {ratio} (ref 0.0–5.0)
Cholesterol, Total: 219 mg/dL — ABNORMAL HIGH (ref 100–199)
HDL: 52 mg/dL (ref >39)
LDL Chol Calc (NIH): 150 mg/dL — ABNORMAL HIGH (ref 0–99)
Triglycerides: 97 mg/dL (ref 0–149)
VLDL Cholesterol Cal: 17 mg/dL (ref 5–40)

## 2023-01-15 LAB — CBC
Hematocrit: 41.4 % (ref 37.5–51.0)
Hemoglobin: 12.5 g/dL — ABNORMAL LOW (ref 13.0–17.7)
MCH: 24.8 pg — ABNORMAL LOW (ref 26.6–33.0)
MCHC: 30.2 g/dL — ABNORMAL LOW (ref 31.5–35.7)
MCV: 82 fL (ref 79–97)
Platelets: 319 10*3/uL (ref 150–450)
RBC: 5.04 x10E6/uL (ref 4.14–5.80)
RDW: 14.6 % (ref 11.6–15.4)
WBC: 5.4 10*3/uL (ref 3.4–10.8)

## 2023-01-15 LAB — COMPREHENSIVE METABOLIC PANEL
ALT: 17 [IU]/L (ref 0–44)
AST: 17 [IU]/L (ref 0–40)
Albumin: 4.1 g/dL (ref 3.8–4.9)
Alkaline Phosphatase: 67 [IU]/L (ref 44–121)
BUN/Creatinine Ratio: 9 (ref 9–20)
BUN: 10 mg/dL (ref 6–24)
Bilirubin Total: 0.3 mg/dL (ref 0.0–1.2)
CO2: 27 mmol/L (ref 20–29)
Calcium: 9.4 mg/dL (ref 8.7–10.2)
Chloride: 101 mmol/L (ref 96–106)
Creatinine, Ser: 1.13 mg/dL (ref 0.76–1.27)
Globulin, Total: 2.7 g/dL (ref 1.5–4.5)
Glucose: 106 mg/dL — ABNORMAL HIGH (ref 70–99)
Potassium: 4.3 mmol/L (ref 3.5–5.2)
Sodium: 140 mmol/L (ref 134–144)
Total Protein: 6.8 g/dL (ref 6.0–8.5)
eGFR: 75 mL/min/{1.73_m2} (ref >59)

## 2023-01-20 MED ORDER — ROSUVASTATIN CALCIUM 20 MG PO TABS: 20.0000 mg | ORAL_TABLET | Freq: Every evening | ORAL | 3 refills | Status: DC

## 2023-01-23 ENCOUNTER — Other Ambulatory Visit: Payer: Self-pay

## 2023-01-23 ENCOUNTER — Telehealth: Payer: Self-pay

## 2023-01-23 DIAGNOSIS — Z122 Encounter for screening for malignant neoplasm of respiratory organs: Secondary | ICD-10-CM

## 2023-01-23 DIAGNOSIS — Z87891 Personal history of nicotine dependence: Secondary | ICD-10-CM

## 2023-01-23 DIAGNOSIS — R911 Solitary pulmonary nodule: Secondary | ICD-10-CM

## 2023-01-23 NOTE — Telephone Encounter (Signed)
 6 month CT order placed and reviewed with Noah Lodge, NP. Patient aware insurance may not pay for scan and would like to proceed anyway. If he speaks with insurance and they advise him they will not pay, patient may delay until the summer.

## 2023-02-12 ENCOUNTER — Ambulatory Visit
Admission: RE | Admit: 2023-02-12 | Discharge: 2023-02-12 | Disposition: A | Discharge status: Home / Self Care | Payer: Managed Care, Other (non HMO) | Source: Ambulatory Visit | Attending: Acute Care | Admitting: Acute Care

## 2023-02-12 ENCOUNTER — Telehealth: Payer: Self-pay | Admitting: Acute Care

## 2023-02-12 ENCOUNTER — Inpatient Hospital Stay: Admission: RE | Admit: 2023-02-12 | Payer: Managed Care, Other (non HMO) | Source: Ambulatory Visit

## 2023-02-12 DIAGNOSIS — Z122 Encounter for screening for malignant neoplasm of respiratory organs: Secondary | ICD-10-CM

## 2023-02-12 DIAGNOSIS — Z87891 Personal history of nicotine dependence: Secondary | ICD-10-CM

## 2023-02-12 NOTE — Telephone Encounter (Signed)
Received call from [facility]. States that the location of CT on 1/29 is closed and will need to be rescheduled.

## 2023-02-12 NOTE — Telephone Encounter (Signed)
Rescheduled for today at 4:00pm at correct location.

## 2023-02-13 ENCOUNTER — Other Ambulatory Visit: Payer: Managed Care, Other (non HMO)

## 2023-02-18 DIAGNOSIS — E785 Hyperlipidemia, unspecified: Secondary | ICD-10-CM

## 2023-02-18 MED ORDER — ROSUVASTATIN CALCIUM 20 MG PO TABS: 20.0000 mg | ORAL_TABLET | Freq: Every day | ORAL | 0 refills | Status: DC

## 2023-02-22 ENCOUNTER — Other Ambulatory Visit: Payer: Self-pay

## 2023-02-22 DIAGNOSIS — Z87891 Personal history of nicotine dependence: Secondary | ICD-10-CM

## 2023-02-22 DIAGNOSIS — Z122 Encounter for screening for malignant neoplasm of respiratory organs: Secondary | ICD-10-CM

## 2023-03-17 ENCOUNTER — Other Ambulatory Visit: Payer: Self-pay | Admitting: Primary Care

## 2023-03-17 DIAGNOSIS — E785 Hyperlipidemia, unspecified: Secondary | ICD-10-CM

## 2023-03-28 DIAGNOSIS — E785 Hyperlipidemia, unspecified: Secondary | ICD-10-CM

## 2023-03-28 MED ORDER — ROSUVASTATIN CALCIUM 20 MG PO TABS
20.0000 mg | ORAL_TABLET | Freq: Every day | ORAL | 0 refills | Status: DC
Start: 2023-03-28 — End: 2023-05-06

## 2023-05-06 ENCOUNTER — Other Ambulatory Visit: Payer: Self-pay | Admitting: Primary Care

## 2023-05-06 DIAGNOSIS — E785 Hyperlipidemia, unspecified: Secondary | ICD-10-CM

## 2023-05-06 MED ORDER — ROSUVASTATIN CALCIUM 20 MG PO TABS: 20.0000 mg | ORAL_TABLET | Freq: Every day | ORAL | 0 refills | Status: DC

## 2023-05-06 NOTE — Telephone Encounter (Signed)
 Copied from CRM [id number]. Topic: Clinical - Medication Refill >> May 06, 2023  1:23 PM Adonis Hoot wrote: Most Recent Primary Care Visit:  Provider: CLARK, KATHERINE K  Department: [facility]  Visit Type: PHYSICAL  Date: 01/14/2023  Medication: rosuvastatin  (CRESTOR ) 20 MG tablet  Has the patient contacted their pharmacy? Yes (Agent: If no, request that the patient contact the pharmacy for the refill. If patient does not wish to contact the pharmacy document the reason why and proceed with request.) (Agent: If yes, when and what did the pharmacy advise?)  Is this the correct pharmacy for this prescription? Yes If no, delete pharmacy and type the correct one.  This is the patient's preferred pharmacy:    [id number] - [place], [place] - [address] [place] [address] Phone: [phone] Fax: [phone]     Has the prescription been filled recently? Yes  Is the patient out of the medication? Yes  Has the patient been seen for an appointment in the last year OR does the patient have an upcoming appointment? Yes  Can we respond through MyChart? Yes  Agent: Please be advised that Rx refills may take up to 3 business days. We ask that you follow-up with your pharmacy.

## 2023-05-08 ENCOUNTER — Other Ambulatory Visit (INDEPENDENT_AMBULATORY_CARE_PROVIDER_SITE_OTHER): Payer: Self-pay

## 2023-05-08 ENCOUNTER — Ambulatory Visit: Admitting: Orthopedic Surgery

## 2023-05-08 DIAGNOSIS — M25521 Pain in right elbow: Secondary | ICD-10-CM

## 2023-05-10 ENCOUNTER — Encounter: Payer: Self-pay | Admitting: Orthopedic Surgery

## 2023-05-10 NOTE — Progress Notes (Signed)
 Office Visit Note   Patient: Noah Wright           Date of Birth: 1965-06-27           MRN: [id number] Visit Date: 05/08/2023 Requested by: Gabriel Izaac, NP [address] [place],  [place] [address] PCP: Gabriel Ulrich, NP  Subjective: Chief Complaint  Patient presents with   Other    Right elbow pain x 1 month. Painful extension, some swelling, elbow is tender to touch. No injury. No better after a few days of rest.    HPI: Noah Wright is a 58 y.o. male who presents to the office reporting right elbow pain.  Localizes to the lateral aspect of the elbow.  Been going on for several months.  Takes [facility] for headaches but otherwise not much in terms of medication for this problem.  Did try brace which was not particularly helpful.  Denies any neck pain or shoulder symptoms.  He has also tried something topical.  Difficult for him to do any activities with wrist extension.  He is able to use the hammer and chisel which he describes as tolerable.  Hard for him to straighten the elbow in the morning..                ROS: All systems reviewed are negative as they relate to the chief complaint within the history of present illness.  Patient denies fevers or chills.  Assessment & Plan: Visit Diagnoses:  1. Pain in right elbow     Plan: Impression is common extensor tendinosis.  Not too bad right now.  We talked about PRP injection as well as physical therapy and stretching.  He is going to try continued activity modification with modalities.  If he gets worse we could consider more advanced intervention.  Follow-Up Instructions: No follow-ups on file.   Orders:  Orders Placed This Encounter  Procedures   XR Elbow 2 Views Right   No orders of the defined types were placed in this encounter.     Procedures: No procedures performed   Clinical Data: No additional findings.  Objective: Vital Signs: There were no vitals taken for this visit.  Physical Exam:   Constitutional: Patient appears well-developed HEENT:  Head: Normocephalic Eyes:EOM are normal Neck: Normal range of motion Cardiovascular: Normal rate Pulmonary/chest: Effort normal Neurologic: Patient is alert Skin: Skin is warm Psychiatric: Patient has normal mood and affect  Ortho Exam: Ortho exam demonstrates full range of motion of the right elbow.  Does have tenderness over that lateral epicondyle with resisted finger and wrist extension.  Biceps tendon is palpable and intact and functional along with the triceps tendon.  No medial sided tenderness.  No subluxation of the ulnar nerve with elbow flexion.  Motor or sensory function of the hand otherwise intact.  Specialty Comments:  No specialty comments available.  Imaging: No results found.   PMFS History: Patient Active Problem List   Diagnosis Date Noted   Chronic thoracic back pain 01/14/2023   CAD (coronary artery disease) 12/20/2021   Colon cancer screening    Chronic pain of both knees 11/30/2020   Hyperlipidemia 07/31/2019   Prediabetes 07/31/2019   Preventative health care 03/24/2018   Pulmonary emphysema (HCC) 12/24/2017   Bronchiectasis (HCC) 07/11/2017   Essential hypertension 09/14/2016   Obesity (BMI 30-39.9) 09/14/2016   Tobacco dependence 06/27/2012   Past Medical History:  Diagnosis Date   Asthma    Diverticulitis  GERD (gastroesophageal reflux disease)    Herpes zoster without complication 10/01/2018   Hyperlipidemia    Hypertension    Migraines    Shingles    Smoker 06/27/2012    Family History  Problem Relation Age of Onset   Arthritis Mother    Hearing loss Mother    Heart disease Mother    Hypertension Father    Alcohol abuse Father    Cancer Father        bladder/kidney   Hearing loss Father    Hyperlipidemia Father     Past Surgical History:  Procedure Laterality Date   COLONOSCOPY WITH PROPOFOL  N/A 02/02/2021   Procedure: COLONOSCOPY WITH PROPOFOL ;  Surgeon: Marnee Sink,  MD;  Location: [facility] ENDOSCOPY;  Service: Endoscopy;  Laterality: N/A;   DG GALL BLADDER  1981   Social History   Occupational History   Not on file  Tobacco Use   Smoking status: Former    Current packs/day: 0.00    Average packs/day: 0.5 packs/day for 43.0 years (21.5 ttl pk-yrs)    Types: Cigarettes    Start date: 05/16/1978    Quit date: 05/15/2021    Years since quitting: 1.9   Smokeless tobacco: Current    Types: Chew  Substance and Sexual Activity   Alcohol use: No   Drug use: No   Sexual activity: Not on file

## 2023-07-25 ENCOUNTER — Telehealth: Payer: Self-pay | Admitting: Primary Care

## 2023-07-25 NOTE — Telephone Encounter (Signed)
 Patient would like to know if he should have lab work done prior to his visit on 24th.  Called to ask if he wanted to speak to triage nurse regarding his mild chest pain, he declined

## 2023-07-25 NOTE — Telephone Encounter (Signed)
 Called and spoke with patient, advised he does not need to come in ahead of time to have labs done. Advised he has an order to have cholesterol rechecked and can have that done when he is here for his visit. Advised patient is not due to CPE yet, but to keep appt to address the chest pain. Patient again stated he would wait and speak to [facility] regarding the mild chest pains.

## 2023-08-08 ENCOUNTER — Encounter: Payer: Self-pay | Admitting: Primary Care

## 2023-08-08 ENCOUNTER — Ambulatory Visit
Admission: RE | Admit: 2023-08-08 | Discharge: 2023-08-08 | Disposition: A | Discharge status: Home / Self Care | Source: Ambulatory Visit | Attending: Primary Care

## 2023-08-08 ENCOUNTER — Ambulatory Visit: Admitting: Primary Care

## 2023-08-08 VITALS — BP 152/90 | HR 55 | Temp 98.1°F | Ht 71.0 in | Wt 204.0 lb

## 2023-08-08 DIAGNOSIS — R079 Chest pain, unspecified: Secondary | ICD-10-CM | POA: Insufficient documentation

## 2023-08-08 DIAGNOSIS — R519 Headache, unspecified: Secondary | ICD-10-CM | POA: Diagnosis not present

## 2023-08-08 DIAGNOSIS — I2510 Atherosclerotic heart disease of native coronary artery without angina pectoris: Secondary | ICD-10-CM

## 2023-08-08 DIAGNOSIS — E785 Hyperlipidemia, unspecified: Secondary | ICD-10-CM | POA: Diagnosis not present

## 2023-08-08 DIAGNOSIS — R7303 Prediabetes: Secondary | ICD-10-CM

## 2023-08-08 DIAGNOSIS — I10 Essential (primary) hypertension: Secondary | ICD-10-CM | POA: Diagnosis not present

## 2023-08-08 MED ORDER — VALSARTAN 80 MG PO TABS: 80.0000 mg | ORAL_TABLET | Freq: Every day | ORAL | 0 refills | Status: DC

## 2023-08-08 NOTE — Patient Instructions (Addendum)
 Stop by the lab and xray prior to leaving today. I will notify you of your results once received.   You will either be contacted via phone regarding your referral to cardiology, or you may receive a letter on your MyChart portal from our referral team with instructions for scheduling an appointment. Please let us  know if you have not been contacted by anyone within two weeks.  Start taking aspirin 81 mg once daily.   Start valsartan  80 mg once daily for high blood pressure. Continue hydrochlorothiazide  25 mg daily.  Please schedule a follow up visit to meet back with me in 2-3 weeks for blood pressure check.   It was a pleasure to see you today!

## 2023-08-08 NOTE — Assessment & Plan Note (Signed)
 Seem to be originating from the cervical spine, but cannot exclude hypertension to be contributing.  X-ray of the cervical spine ordered and pending. Will also work to lower blood pressure.

## 2023-08-08 NOTE — Assessment & Plan Note (Signed)
 Above goal today, also upon recheck and during last 2 doctors visits.  Continue HCTZ 25 mg daily. Add valsartan  80 mg daily. Avoid beta-blocker at this time given heart rate.  Close follow-up in 2 weeks for blood pressure check.

## 2023-08-08 NOTE — Progress Notes (Signed)
 Subjective:    Patient ID: Noah Wright, male    DOB: 1965-04-12, 58 y.o.   MRN: [id number]  Chest Pain  Associated symptoms include headaches and shortness of breath.    Noah Wright is a very pleasant 58 y.o. male who presents today for form completion and to discuss several concerns.     1) Hypertension: Currently managed on hydrochlorothiazide  25 mg daily for hypertension.  Over the last 2 weeks he's noticed elevated blood pressure readings in the 160/100 range. He has a chronic history of migraines, hasn't really noticed an increase in headaches.   BP Readings from Last 3 Encounters:  08/08/23 (!) 152/90  01/14/23 132/84  12/20/21 130/86     2) Chest Pain: His chest pain is located to the mid and right chest for which he's noticed intermittently for the last 1 month. He's not sure if his pain occurs with rest or exertion. He does experience shortness of breath with moderate exertion which is not new. He denies cough.   He has a history of age advanced three-vessel coronary artery calcification as noted from CT lung cancer screening from February 2025.  He is managed on rosuvastatin  20 mg daily which he resumed in January 2025. He has not returned for repeat lipid panel as recommended.  He underwent stress test in 2015 which was negative. He underwent echocardiogram which showed LVEH of 55-60%, normal wall motion, normal left ventricular diastolic function, no valve abnormality.  3) Chronic Headaches: Chronic for years, occurs once weekly , mostly originates to the lower neck with radiation up the occipital, parietal, and frontal regions. He will take [facility] Powder, Advil, Tylenol  once weekly on average with improvement.   He denies photophobia, phonophobia, nausea, frequent neck pain.    Review of Systems  Eyes:  Negative for visual disturbance.  Respiratory:  Positive for shortness of breath.   Cardiovascular:  Positive for chest pain. Negative for leg swelling.   Musculoskeletal:  Positive for neck pain.  Neurological:  Positive for headaches.         Past Medical History:  Diagnosis Date   Asthma    Diverticulitis    GERD (gastroesophageal reflux disease)    Herpes zoster without complication 10/01/2018   Hyperlipidemia    Hypertension    Migraines    Shingles    Smoker 06/27/2012    Social History   Socioeconomic History   Marital status: Married    Spouse name: Not on file   Number of children: Not on file   Years of education: Not on file   Highest education level: Not on file  Occupational History   Not on file  Tobacco Use   Smoking status: Former    Current packs/day: 0.00    Average packs/day: 0.5 packs/day for 43.0 years (21.5 ttl pk-yrs)    Types: Cigarettes    Start date: 05/16/1978    Quit date: 05/15/2021    Years since quitting: 2.2   Smokeless tobacco: Current    Types: Chew  Substance and Sexual Activity   Alcohol use: No   Drug use: No   Sexual activity: Not on file  Other Topics Concern   Not on file  Social History Narrative   Married.   3 children.   Works in Field seismologist.   Enjoys spending time outdoors.   Social Drivers of Corporate investment banker Strain: Not on file  Food Insecurity: Not on file  Transportation Needs: Not on file  Physical Activity: Not on file  Stress: Not on file  Social Connections: Not on file  Intimate Partner Violence: Not on file    Past Surgical History:  Procedure Laterality Date   COLONOSCOPY WITH PROPOFOL  N/A 02/02/2021   Procedure: COLONOSCOPY WITH PROPOFOL ;  Surgeon: Jinny Carmine, MD;  Location: [facility] ENDOSCOPY;  Service: Endoscopy;  Laterality: N/A;   DG GALL BLADDER  1981    Family History  Problem Relation Age of Onset   Arthritis Mother    Hearing loss Mother    Heart disease Mother    Hypertension Father    Alcohol abuse Father    Cancer Father        bladder/kidney   Hearing loss Father    Hyperlipidemia Father     Allergies  Allergen  Reactions   Lipitor [Atorvastatin ] Other (See Comments)    Myalgias, weakness (20 mg)    Current Outpatient Medications on File Prior to Visit  Medication Sig Dispense Refill   hydrochlorothiazide  (HYDRODIURIL ) 25 MG tablet TAKE 1 TABLET BY MOUTH DAILY FOR BLOOD PRESSURE 90 tablet 2   rosuvastatin  (CRESTOR ) 20 MG tablet Take 1 tablet (20 mg total) by mouth every evening. for cholesterol. 90 tablet 3   albuterol  (VENTOLIN  HFA) 108 (90 Base) MCG/ACT inhaler USE 2 INHALATIONS BY MOUTH INTO  THE LUNGS EVERY 6 HOURS AS  NEEDED FOR WHEEZING OR SHORTNESS OF BREATH (Patient not taking: Reported on 08/08/2023) 17 g 0   No current facility-administered medications on file prior to visit.    BP (!) 152/90   Pulse (!) 55   Temp 98.1 F (36.7 C) (Temporal)   Ht 5' 11 (1.803 m)   Wt 204 lb (92.5 kg)   SpO2 99%   BMI 28.45 kg/m  Objective:   Physical Exam Cardiovascular:     Rate and Rhythm: Regular rhythm. Bradycardia present.  Pulmonary:     Effort: Pulmonary effort is normal.     Breath sounds: Normal breath sounds.  Musculoskeletal:     Cervical back: Neck supple.  Skin:    General: Skin is warm and dry.  Neurological:     Mental Status: He is alert and oriented to person, place, and time.  Psychiatric:        Mood and Affect: Mood normal.           Assessment & Plan:  Chest pain, unspecified type Assessment & Plan: EKG today with sinus bradycardia, rate of 49. No PACs/PVCs.  No acute ST changes.  Does appear similar to EKG from 2015 except for HR.  Given known CAD, with all the symptoms and medical history, will refer to cardiology for further evaluation.  Reviewed echocardiogram and stress test from 2015.  Referral placed to cardiology. Continue statin therapy.  Add aspirin 81 mg daily. Repeat lipid panel pending.  Strict ED precautions provided.   Orders: -     EKG 12-Lead -     Ambulatory referral to Cardiology  Coronary artery disease involving native heart  without angina pectoris, unspecified vessel or lesion type -     Lipid panel -     Basic metabolic panel with GFR -     Ambulatory referral to Cardiology  Hyperlipidemia, unspecified hyperlipidemia type -     Lipid panel -     Basic metabolic panel with GFR  Essential hypertension Assessment & Plan: Above goal today, also upon recheck and during last 2 doctors visits.  Continue HCTZ 25 mg daily. Add valsartan  80 mg  daily. Avoid beta-blocker at this time given heart rate.  Close follow-up in 2 weeks for blood pressure check.  Orders: -     Valsartan ; Take 1 tablet (80 mg total) by mouth daily. for blood pressure.  Dispense: 30 tablet; Refill: 0  Frequent headaches Assessment & Plan: Seem to be originating from the cervical spine, but cannot exclude hypertension to be contributing.  X-ray of the cervical spine ordered and pending. Will also work to lower blood pressure.  Orders: -     DG Cervical Spine Complete  Prediabetes -     Hemoglobin A1c        Comer MARLA Gaskins, NP

## 2023-08-08 NOTE — Assessment & Plan Note (Signed)
 EKG today with sinus bradycardia, rate of 49. No PACs/PVCs.  No acute ST changes.  Does appear similar to EKG from 2015 except for HR.  Given known CAD, with all the symptoms and medical history, will refer to cardiology for further evaluation.  Reviewed echocardiogram and stress test from 2015.  Referral placed to cardiology. Continue statin therapy.  Add aspirin 81 mg daily. Repeat lipid panel pending.  Strict ED precautions provided.

## 2023-08-08 NOTE — Addendum Note (Signed)
 Addended by: HOPE VEVA PARAS on: 08/08/2023 07:58 AM   Modules accepted: Orders

## 2023-08-09 ENCOUNTER — Ambulatory Visit: Payer: Self-pay | Admitting: Primary Care

## 2023-08-09 LAB — HEMOGLOBIN A1C
Est. average glucose Bld gHb Est-mCnc: 120 mg/dL
Hgb A1c MFr Bld: 5.8 % — ABNORMAL HIGH (ref 4.8–5.6)

## 2023-08-09 LAB — BASIC METABOLIC PANEL WITH GFR
BUN/Creatinine Ratio: 9 — ABNORMAL LOW (ref 10–24)
BUN: 9 mg/dL (ref 8–27)
CO2: 23 mmol/L (ref 20–29)
Calcium: 9 mg/dL (ref 8.6–10.2)
Chloride: 106 mmol/L (ref 96–106)
Creatinine, Ser: 0.98 mg/dL (ref 0.76–1.27)
Glucose: 91 mg/dL (ref 70–99)
Potassium: 4.6 mmol/L (ref 3.5–5.2)
Sodium: 143 mmol/L (ref 134–144)
eGFR: 88 mL/min/1.73 (ref >59)

## 2023-08-09 LAB — LIPID PANEL
Chol/HDL Ratio: 2.5 ratio (ref 0.0–5.0)
Cholesterol, Total: 124 mg/dL (ref 100–199)
HDL: 49 mg/dL (ref >39)
LDL Chol Calc (NIH): 62 mg/dL (ref 0–99)
Triglycerides: 61 mg/dL (ref 0–149)
VLDL Cholesterol Cal: 13 mg/dL (ref 5–40)

## 2023-08-13 ENCOUNTER — Telehealth: Payer: Self-pay

## 2023-08-13 NOTE — Telephone Encounter (Signed)
 Copied from CRM #[id number]. Topic: Clinical - Prescription Issue >> Aug 13, 2023 10:27 AM Jasmin G wrote: Reason for CRM: Mrs.Kate from [id number] - [place], [place] called regarding pt's medication rosuvastatin  (CRESTOR ) 20 MG tablet, she states that this med refill has been placed multiple times with no response from clinic, call back if needed at [phone]

## 2023-08-13 NOTE — Telephone Encounter (Signed)
 Called and spoke with patient states he is going to be using [facility] for his prescriptions. He has a rx on file with them. He will call [facility] and let them know to stop sending the requests.

## 2023-08-15 ENCOUNTER — Telehealth: Payer: Self-pay | Admitting: Primary Care

## 2023-08-15 NOTE — Telephone Encounter (Signed)
 PPW has been faxed and sent to scanning.

## 2023-08-15 NOTE — Telephone Encounter (Signed)
 Pt came in and signed health screening form. The form is placed in Kate's folder up front.

## 2023-08-22 ENCOUNTER — Ambulatory Visit (INDEPENDENT_AMBULATORY_CARE_PROVIDER_SITE_OTHER): Admitting: Primary Care

## 2023-08-22 ENCOUNTER — Encounter: Payer: Self-pay | Admitting: Primary Care

## 2023-08-22 VITALS — BP 148/80 | HR 70 | Temp 97.2°F | Ht 71.0 in | Wt 210.0 lb

## 2023-08-22 DIAGNOSIS — I10 Essential (primary) hypertension: Secondary | ICD-10-CM

## 2023-08-22 MED ORDER — VALSARTAN 160 MG PO TABS: 160.0000 mg | ORAL_TABLET | Freq: Every day | ORAL | 0 refills | Status: DC

## 2023-08-22 NOTE — Progress Notes (Signed)
 Subjective:    Patient ID: Noah Wright, male    DOB: 03-14-1965, 58 y.o.   MRN: [id number]  HPI  Noah Wright is a very pleasant 58 y.o. male with a history of hypertension, CAD, pulmonary emphysema, prediabetes, hyperlipidemia, frequent headaches who presents today for follow-up of hypertension.  He was last evaluated on 08/08/23 for chest pain. During this visit his BP was elevated in the office, also with home readings despite management on hydrochlorothiazide  25 mg daily. Given these readings, coupled with symptoms, valsartan  80 mg was added. He is here for follow up today.  Since his last visit he's compliant to hydrochlorothiazide  25 mg daily and valsartan  80 mg daily. He continues to experience headaches which are chronic. He has checked his BP at home a few times, doesn't recall the readings. His chest pain has improved.   BP Readings from Last 3 Encounters:  08/22/23 (!) 148/80  08/08/23 (!) 152/90  01/14/23 132/84      Review of Systems  Eyes:  Negative for visual disturbance.  Respiratory:  Negative for shortness of breath.   Cardiovascular:  Negative for chest pain.  Neurological:  Positive for headaches.         Past Medical History:  Diagnosis Date   Asthma    Diverticulitis    GERD (gastroesophageal reflux disease)    Herpes zoster without complication 10/01/2018   Hyperlipidemia    Hypertension    Migraines    Shingles    Smoker 06/27/2012    Social History   Socioeconomic History   Marital status: Married    Spouse name: Not on file   Number of children: Not on file   Years of education: Not on file   Highest education level: Not on file  Occupational History   Not on file  Tobacco Use   Smoking status: Former    Current packs/day: 0.00    Average packs/day: 0.5 packs/day for 43.0 years (21.5 ttl pk-yrs)    Types: Cigarettes    Start date: 05/16/1978    Quit date: 05/15/2021    Years since quitting: 2.2   Smokeless tobacco: Current     Types: Chew  Substance and Sexual Activity   Alcohol use: No   Drug use: No   Sexual activity: Not on file  Other Topics Concern   Not on file  Social History Narrative   Married.   3 children.   Works in Field seismologist.   Enjoys spending time outdoors.   Social Drivers of Corporate investment banker Strain: Not on file  Food Insecurity: Not on file  Transportation Needs: Not on file  Physical Activity: Not on file  Stress: Not on file  Social Connections: Not on file  Intimate Partner Violence: Not on file    Past Surgical History:  Procedure Laterality Date   COLONOSCOPY WITH PROPOFOL  N/A 02/02/2021   Procedure: COLONOSCOPY WITH PROPOFOL ;  Surgeon: Jinny Carmine, MD;  Location: [facility] ENDOSCOPY;  Service: Endoscopy;  Laterality: N/A;   DG GALL BLADDER  1981    Family History  Problem Relation Age of Onset   Arthritis Mother    Hearing loss Mother    Heart disease Mother    Hypertension Father    Alcohol abuse Father    Cancer Father        bladder/kidney   Hearing loss Father    Hyperlipidemia Father     Allergies  Allergen Reactions   Lipitor [Atorvastatin ] Other (See Comments)  Myalgias, weakness (20 mg)    Current Outpatient Medications on File Prior to Visit  Medication Sig Dispense Refill   albuterol  (VENTOLIN  HFA) 108 (90 Base) MCG/ACT inhaler USE 2 INHALATIONS BY MOUTH INTO  THE LUNGS EVERY 6 HOURS AS  NEEDED FOR WHEEZING OR SHORTNESS OF BREATH 17 g 0   hydrochlorothiazide  (HYDRODIURIL ) 25 MG tablet TAKE 1 TABLET BY MOUTH DAILY FOR BLOOD PRESSURE 90 tablet 2   rosuvastatin  (CRESTOR ) 20 MG tablet Take 1 tablet (20 mg total) by mouth every evening. for cholesterol. 90 tablet 3   No current facility-administered medications on file prior to visit.    BP (!) 148/80   Pulse 70   Temp (!) 97.2 F (36.2 C) (Temporal)   Ht 5' 11 (1.803 m)   Wt 210 lb (95.3 kg)   SpO2 98%   BMI 29.29 kg/m  Objective:   Physical Exam Cardiovascular:     Rate and Rhythm:  Normal rate and regular rhythm.  Pulmonary:     Effort: Pulmonary effort is normal.     Breath sounds: Normal breath sounds.  Musculoskeletal:     Cervical back: Neck supple.  Skin:    General: Skin is warm and dry.  Neurological:     Mental Status: He is alert and oriented to person, place, and time.  Psychiatric:        Mood and Affect: Mood normal.           Assessment & Plan:  Essential hypertension Assessment & Plan: Slight improvement, not at goal.   Continue hydrochlorothiazide  25 mg daily. Increase valsartan  to 160 mg daily.  BMP pending today. Follow up in 2-3 weeks.   Orders: -     Valsartan ; Take 1 tablet (160 mg total) by mouth daily. for blood pressure.  Dispense: 30 tablet; Refill: 0 -     Basic metabolic panel with GFR        Comer MARLA Gaskins, NP

## 2023-08-22 NOTE — Patient Instructions (Addendum)
 Continue taking hydrochlorothiazide  25 mg daily for blood pressure.  Will increase your valsartan  to 160 mg daily.  I sent a new prescription to your pharmacy.  Stop by the lab prior to leaving today. I will notify you of your results once received.   Please schedule a follow up visit to meet back with me in 2-3 weeks for blood pressure check.   It was a pleasure to see you today!

## 2023-08-22 NOTE — Addendum Note (Signed)
 Addended by: HOPE VEVA PARAS on: 08/22/2023 08:03 AM   Modules accepted: Orders

## 2023-08-22 NOTE — Assessment & Plan Note (Signed)
 Slight improvement, not at goal.   Continue hydrochlorothiazide  25 mg daily. Increase valsartan  to 160 mg daily.  BMP pending today. Follow up in 2-3 weeks.

## 2023-08-23 ENCOUNTER — Ambulatory Visit: Payer: Self-pay | Admitting: Primary Care

## 2023-08-24 LAB — BASIC METABOLIC PANEL WITH GFR
BUN/Creatinine Ratio: 9 — ABNORMAL LOW (ref 10–24)
BUN: 10 mg/dL (ref 8–27)
CO2: 23 mmol/L (ref 20–29)
Calcium: 9.5 mg/dL (ref 8.6–10.2)
Chloride: 104 mmol/L (ref 96–106)
Creatinine, Ser: 1.08 mg/dL (ref 0.76–1.27)
Glucose: 81 mg/dL (ref 70–99)
Potassium: 5 mmol/L (ref 3.5–5.2)
Sodium: 139 mmol/L (ref 134–144)
eGFR: 79 mL/min/1.73 (ref >59)

## 2023-09-03 NOTE — Progress Notes (Unsigned)
  Cardiology Office Note   Date:  09/06/2023  ID:  Noah Wright, DOB 1965-08-01, MRN [id number] PCP: Gretta Comer POUR, NP  [facility] Providers Cardiologist:  Caron Poser, MD     History of Present Illness Noah Wright is a 58 y.o. male PMH reported CAD, HLD, HTN who presents for further evaluation of chest pain.  Patient reports he has had a few episodes of fleeting chest pain recently.  He notes the pain seems to come on randomly and is located substernally and sometimes to the right.  Sensation is somewhat difficult to describe, but he characterizes it as tight.  Sometimes occurs with exertion, sometimes at rest.  He is a former smoker.  Denies any known personal history of CVD.  Last LDL 62 07/2023.  Relevant CVD History - Mild CAC from CT chest 01/2023 (my review) - Normal echocardiogram 10/2013 - Normal SPECT 10/2013   ROS: Pt denies any chest discomfort, jaw pain, arm pain, palpitations, syncope, presyncope, orthopnea, PND, or LE edema.  Studies Reviewed I have independently reviewed the patient's ECG, recent CT scan, recent blood work, and prior cardiac testing.  Physical Exam VS:  BP 130/82 (BP Location: Right Arm, Patient Position: Sitting, Cuff Size: Normal)   Pulse (!) 52   Ht 5' 11 (1.803 m)   Wt 205 lb 12.8 oz (93.4 kg)   SpO2 97%   BMI 28.70 kg/m        Wt Readings from Last 3 Encounters:  09/06/23 205 lb 12.8 oz (93.4 kg)  09/04/23 207 lb (93.9 kg)  08/22/23 210 lb (95.3 kg)    GEN: No acute distress. NECK: No JVD; No carotid bruits. CARDIAC: RRR, no murmurs, rubs, gallops. RESPIRATORY:  Clear to auscultation. EXTREMITIES:  Warm and well-perfused. No edema.  ASSESSMENT AND PLAN Possible cardiac chest discomfort Mild coronary artery calcifications Patient presents with chest discomfort which is possibly cardiac.  He is a former smoker and does have ASCVD risk factors.  He has mild CAC on a chest CT from 01/2023, so I think a coronary CTA  would be interpretable.  His relevant comorbidities are currently well-controlled.  Plan: - Start ASA 81 mg daily - Continue Crestor  20 mg daily; LDL goal less than 70 - Coronary CT angiogram  HTN Suboptimal control recently, though he notes there was a miscommunication regarding his BP regimen.  His BP in office looks good today.  I recommended that he check them at home and as long as he is consistently 130/80 or less, he can maintain his current regimen.  If he is consistently above that, there is plenty of room to go up on his antihypertensives.  Plan: - Continue valsartan  to 160 mg daily and continue HCTZ 12.5 mg daily. - If he remains uncontrolled, would increase the valsartan  followed by the HCTZ if needed  HLD Well-controlled, last LDL at goal.  Continue Crestor  20 mg daily       Dispo: RTC prn  Signed, Caron Poser, MD

## 2023-09-04 ENCOUNTER — Encounter: Payer: Self-pay | Admitting: Primary Care

## 2023-09-04 ENCOUNTER — Ambulatory Visit: Admitting: Primary Care

## 2023-09-04 VITALS — BP 142/86 | HR 50 | Temp 97.4°F | Ht 71.0 in | Wt 207.0 lb

## 2023-09-04 DIAGNOSIS — I10 Essential (primary) hypertension: Secondary | ICD-10-CM

## 2023-09-04 MED ORDER — HYDROCHLOROTHIAZIDE 12.5 MG PO TABS: 12.5000 mg | ORAL_TABLET | Freq: Every day | ORAL | 0 refills | Status: DC

## 2023-09-04 NOTE — Assessment & Plan Note (Signed)
 Above goal, also we just realized he is and has NOT been taking hydrochlorothiazide  25 mg daily.  Continue Valsartan  160 mg daily. Add hydrochlorothiazide  12.5 mg daily  We will plan to see him back in 2-3 weeks for BP check and BMP

## 2023-09-04 NOTE — Progress Notes (Addendum)
 Subjective:    Patient ID: Noah Wright, male    DOB: [birth year], 58 y.o.   MRN: [id number]  [name] is a very pleasant 58 y.o. male with a history of hypertension, CAD, pulmonary emphysema, tobacco use, prediabetes, hyperlipidemia who presents today for follow-up hypertension.  He was last evaluated on 08/22/2023 for follow-up of hypertension.  During this visit his blood pressure had improved but was still above goal despite management on hydrochlorothiazide  25 mg daily and valsartan  80 mg daily.  He also continued to notice headaches which were chronic.  During this visit his hydrochlorothiazide  25 mg daily was continued and his valsartan  was increased to 160 mg daily.  He is here for follow-up today.  Since his last visit he is compliant to valsartan  160 mg daily. He has not taken hydrochlorothiazide  25 mg in > 1 year. He just realized this today. He is not checking his BP at home. He continues to notice headaches and fatigue.   BP Readings from Last 3 Encounters:  09/04/23 (!) 142/86  08/22/23 (!) 148/80  08/08/23 (!) 152/90     Review of Systems  Constitutional:  Positive for fatigue.  Eyes:  Negative for visual disturbance.  Respiratory:  Negative for shortness of breath.   Cardiovascular:  Negative for chest pain.  Neurological:  Positive for headaches.         Past Medical History:  Diagnosis Date   Asthma    Diverticulitis    GERD (gastroesophageal reflux disease)    Herpes zoster without complication 10/01/2018   Hyperlipidemia    Hypertension    Migraines    Shingles    Smoker 06/27/2012    Social History   Socioeconomic History   Marital status: Married    Spouse name: Not on file   Number of children: Not on file   Years of education: Not on file   Highest education level: Not on file  Occupational History   Not on file  Tobacco Use   Smoking status: Former    Current packs/day: 0.00    Average packs/day: 0.5 packs/day for 43.0 years (21.5 ttl  pk-yrs)    Types: Cigarettes    Start date: 05/16/1978    Quit date: 05/15/2021    Years since quitting: 2.3   Smokeless tobacco: Current    Types: Chew  Substance and Sexual Activity   Alcohol use: No   Drug use: No   Sexual activity: Not on file  Other Topics Concern   Not on file  Social History Narrative   Married.   3 children.   Works in Field seismologist.   Enjoys spending time outdoors.   Social Drivers of Corporate investment banker Strain: Not on file  Food Insecurity: Not on file  Transportation Needs: Not on file  Physical Activity: Not on file  Stress: Not on file  Social Connections: Not on file  Intimate Partner Violence: Not on file    Past Surgical History:  Procedure Laterality Date   COLONOSCOPY WITH PROPOFOL  N/A 02/02/2021   Procedure: COLONOSCOPY WITH PROPOFOL ;  Surgeon: Jinny Carmine, MD;  Location: [facility] ENDOSCOPY;  Service: Endoscopy;  Laterality: N/A;   DG GALL BLADDER  1981    Family History  Problem Relation Age of Onset   Arthritis Mother    Hearing loss Mother    Heart disease Mother    Hypertension Father    Alcohol abuse Father    Cancer Father  bladder/kidney   Hearing loss Father    Hyperlipidemia Father     Allergies  Allergen Reactions   Lipitor [Atorvastatin ] Other (See Comments)    Myalgias, weakness (20 mg)    Current Outpatient Medications on File Prior to Visit  Medication Sig Dispense Refill   rosuvastatin  (CRESTOR ) 20 MG tablet Take 1 tablet (20 mg total) by mouth every evening. for cholesterol. 90 tablet 3   valsartan  (DIOVAN ) 160 MG tablet Take 1 tablet (160 mg total) by mouth daily. for blood pressure. 30 tablet 0   albuterol  (VENTOLIN  HFA) 108 (90 Base) MCG/ACT inhaler USE 2 INHALATIONS BY MOUTH INTO  THE LUNGS EVERY 6 HOURS AS  NEEDED FOR WHEEZING OR SHORTNESS OF BREATH (Patient not taking: Reported on 09/04/2023) 17 g 0   No current facility-administered medications on file prior to visit.    BP (!) 142/86   Pulse  (!) 50   Temp (!) 97.4 F (36.3 C) (Temporal)   Ht 5' 11 (1.803 m)   Wt 207 lb (93.9 kg)   SpO2 98%   BMI 28.87 kg/m  Objective:   Physical Exam Cardiovascular:     Rate and Rhythm: Normal rate and regular rhythm.  Pulmonary:     Effort: Pulmonary effort is normal.     Breath sounds: Normal breath sounds.  Musculoskeletal:     Cervical back: Neck supple.  Skin:    General: Skin is warm and dry.  Neurological:     Mental Status: He is alert and oriented to person, place, and time.  Psychiatric:        Mood and Affect: Mood normal.     Physical Exam        Assessment & Plan:  Essential hypertension Assessment & Plan: Above goal, also we just realized he is and has NOT been taking hydrochlorothiazide  25 mg daily.  Continue Valsartan  160 mg daily. Add hydrochlorothiazide  12.5 mg daily  We will plan to see him back in 2-3 weeks for BP check and BMP  Orders: -     hydroCHLOROthiazide ; Take 1 tablet (12.5 mg total) by mouth daily. for blood pressure.  Dispense: 90 tablet; Refill: 0    Assessment and Plan Assessment & Plan         Comer MARLA Gaskins, NP     History of Present Illness

## 2023-09-04 NOTE — Patient Instructions (Signed)
 Continue taking valsartan  160 mg daily for blood pressure.  Start taking hydrochlorothiazide  12.5 mg once daily for blood pressure.  Please schedule a follow up visit to meet back with me in 2-3 weeks for blood pressure check.   It was a pleasure to see you today!

## 2023-09-06 ENCOUNTER — Ambulatory Visit

## 2023-09-06 VITALS — BP 130/82 | HR 52 | Ht 71.0 in | Wt 205.8 lb

## 2023-09-06 DIAGNOSIS — E782 Mixed hyperlipidemia: Secondary | ICD-10-CM

## 2023-09-06 DIAGNOSIS — R079 Chest pain, unspecified: Secondary | ICD-10-CM

## 2023-09-06 DIAGNOSIS — I2510 Atherosclerotic heart disease of native coronary artery without angina pectoris: Secondary | ICD-10-CM | POA: Diagnosis not present

## 2023-09-06 DIAGNOSIS — Z79899 Other long term (current) drug therapy: Secondary | ICD-10-CM

## 2023-09-06 DIAGNOSIS — R072 Precordial pain: Secondary | ICD-10-CM

## 2023-09-06 DIAGNOSIS — I10 Essential (primary) hypertension: Secondary | ICD-10-CM | POA: Diagnosis not present

## 2023-09-06 MED ORDER — ASPIRIN 81 MG PO TBEC
81.0000 mg | DELAYED_RELEASE_TABLET | Freq: Every day | ORAL | Status: AC
Start: 2023-09-06 — End: ?

## 2023-09-06 NOTE — Patient Instructions (Signed)
 Medication Instructions:  Your physician recommends the following medication changes.  START TAKING: Aspirin  81 mg by mouth daily   *If you need a refill on your cardiac medications before your next appointment, please call your pharmacy*  Lab Work: Your provider would like for you to have following labs drawn today BMP.     Testing/Procedures:   Your cardiac CT will be scheduled at one of the below locations:   [facility] [address] [place], [place] [address] [phone]  OR   [facility] [address]  [place], [place] [address]   If scheduled at [facility], please arrive at the [facility] and Children's Entrance (Entrance C2) of [facility] 30 minutes prior to test start time. You can use the FREE valet parking offered at entrance C (encouraged to control the heart rate for the test)  Proceed to the [facility] Radiology Department (first floor) to check-in and test prep.  All radiology patients and guests should use entrance C2 at [facility], accessed from [facility], even though the hospital's physical address listed is [address].  If scheduled at the Heart and Vascular Tower at [address], please enter the parking lot using the [address] entrance and use the FREE valet service at the patient drop-off area. Enter the building and check-in with registration on the main floor.  Please follow these instructions carefully (unless otherwise directed):  An IV will be required for this test and Nitroglycerin will be given.  Hold all erectile dysfunction medications at least 3 days (72 hrs) prior to test. (Ie viagra, cialis, sildenafil, tadalafil, etc)   On the Night Before the Test: Be sure to Drink plenty of water. Do not consume any caffeinated/decaffeinated beverages or chocolate 12 hours prior to your test. Do not take any antihistamines 12 hours prior to your  test.  On the Day of the Test: Drink plenty of water until 1 hour prior to the test. Do not eat any food 1 hour prior to test. You may take your regular medications prior to the test.  If you take Hydrochlorothiazide  please HOLD on the morning of the test. Patients who wear a continuous glucose monitor MUST remove the device prior to scanning. FEMALES- please wear underwire-free bra if available, avoid dresses & tight clothing      After the Test: Drink plenty of water. After receiving IV contrast, you may experience a mild flushed feeling. This is normal. On occasion, you may experience a mild rash up to 24 hours after the test. This is not dangerous. If this occurs, you can take Benadryl 25 mg, Zyrtec, Claritin, or Allegra and increase your fluid intake. (Patients taking Tikosyn should avoid Benadryl, and may take Zyrtec, Claritin, or Allegra) If you experience trouble breathing, this can be serious. If it is severe call 911 IMMEDIATELY. If it is mild, please call our office.  We will call to schedule your test 2-4 weeks out understanding that some insurance companies will need an authorization prior to the service being performed.   For more information and frequently asked questions, please visit our website : [email or web address]  For non-scheduling related questions, please contact the cardiac imaging nurse navigator should you have any questions/concerns: Cardiac Imaging Nurse Navigators Direct Office Dial: [phone]   For scheduling needs, including cancellations and rescheduling, please call [place], [phone].     Follow-Up: At [facility], you and your health needs are  our priority.  As part of our continuing mission to provide you with exceptional heart care, our providers are all part of one team.  This team includes your primary Cardiologist (physician) and Advanced Practice Providers or APPs (Physician Assistants and Nurse Practitioners) who  all work together to provide you with the care you need, when you need it.  Your next appointment:   As needed  Provider:   Caron Poser, MD

## 2023-09-06 NOTE — Addendum Note (Signed)
 Addended by: DESIDERIO RUSSELL SAILOR on: 09/06/2023 09:12 AM   Modules accepted: Orders

## 2023-09-07 ENCOUNTER — Ambulatory Visit: Payer: Self-pay

## 2023-09-07 LAB — BASIC METABOLIC PANEL WITH GFR
BUN/Creatinine Ratio: 12 (ref 10–24)
BUN: 12 mg/dL (ref 8–27)
CO2: 22 mmol/L (ref 20–29)
Calcium: 9.5 mg/dL (ref 8.6–10.2)
Chloride: 107 mmol/L — ABNORMAL HIGH (ref 96–106)
Creatinine, Ser: 1.04 mg/dL (ref 0.76–1.27)
Glucose: 68 mg/dL — ABNORMAL LOW (ref 70–99)
Potassium: 4.9 mmol/L (ref 3.5–5.2)
Sodium: 143 mmol/L (ref 134–144)
eGFR: 82 mL/min/1.73 (ref >59)

## 2023-09-09 ENCOUNTER — Encounter (HOSPITAL_COMMUNITY): Payer: Self-pay

## 2023-09-10 ENCOUNTER — Other Ambulatory Visit (HOSPITAL_COMMUNITY)

## 2023-09-11 ENCOUNTER — Ambulatory Visit (HOSPITAL_COMMUNITY)
Admission: RE | Admit: 2023-09-11 | Discharge: 2023-09-11 | Disposition: A | Discharge status: Home / Self Care | Source: Ambulatory Visit

## 2023-09-11 DIAGNOSIS — R072 Precordial pain: Secondary | ICD-10-CM | POA: Diagnosis present

## 2023-09-11 MED ORDER — NITROGLYCERIN 0.4 MG SL SUBL
0.8000 mg | SUBLINGUAL_TABLET | Freq: Once | SUBLINGUAL | Status: AC
  Administered 2023-09-11: 0.8 mg via SUBLINGUAL

## 2023-09-11 MED ORDER — IOHEXOL 350 MG/ML SOLN
100.0000 mL | Freq: Once | INTRAVENOUS | Status: AC | PRN
  Administered 2023-09-11: 100 mL via INTRAVENOUS

## 2023-09-14 ENCOUNTER — Other Ambulatory Visit: Payer: Self-pay | Admitting: Primary Care

## 2023-09-14 DIAGNOSIS — I10 Essential (primary) hypertension: Secondary | ICD-10-CM

## 2023-09-18 ENCOUNTER — Ambulatory Visit: Admitting: Primary Care

## 2023-09-24 ENCOUNTER — Ambulatory Visit: Admitting: Primary Care

## 2023-09-24 VITALS — BP 110/76 | HR 76 | Temp 98.0°F | Ht 71.0 in | Wt 209.0 lb

## 2023-09-24 DIAGNOSIS — I2510 Atherosclerotic heart disease of native coronary artery without angina pectoris: Secondary | ICD-10-CM

## 2023-09-24 DIAGNOSIS — E785 Hyperlipidemia, unspecified: Secondary | ICD-10-CM | POA: Diagnosis not present

## 2023-09-24 DIAGNOSIS — I10 Essential (primary) hypertension: Secondary | ICD-10-CM | POA: Diagnosis not present

## 2023-09-24 MED ORDER — ROSUVASTATIN CALCIUM 40 MG PO TABS: 40.0000 mg | ORAL_TABLET | Freq: Every day | ORAL | 1 refills | Status: DC

## 2023-09-24 NOTE — Assessment & Plan Note (Signed)
 Increase rosuvastatin  to 40 mg daily given recent CT coronary morphology results. LDL goal of less than 55.  Repeat lipids in 2 months.

## 2023-09-24 NOTE — Assessment & Plan Note (Signed)
 Controlled.  Continue valsartan  160 mg daily, hydrochlorothiazide  12.5 mg daily.  We discussed that if his lightheadedness became problematic to notify me.  At that point we would reduce losartan  to 80 mg daily. BMP pending.

## 2023-09-24 NOTE — Addendum Note (Signed)
 Addended by: HOPE VEVA PARAS on: 09/24/2023 02:54 PM   Modules accepted: Orders

## 2023-09-24 NOTE — Assessment & Plan Note (Addendum)
 Minimal. Reviewed CT coronary morphology from August 2025 with patient in depth today.  All questions answered.  Increase rosuvastatin  to 40 mg for LDL goal below 55.  New prescription sent to pharmacy. Repeat lipid panel in 2 months.

## 2023-09-24 NOTE — Progress Notes (Signed)
 Subjective:    Patient ID: Noah Wright, male    DOB: 1965-12-12, 58 y.o.   MRN: [id number]  Noah Wright is a very pleasant 58 y.o. male with a history of hypertension, CAD, tobacco use, hyperlipidemia, prediabetes who presents today for follow-up of hypertension.  He was last evaluated on 09/04/2023 for follow-up hypertension.  During this visit his blood pressure had not improved but he admitted only taking valsartan  160 mg daily and not taking hydrochlorothiazide  25 mg daily.  During this visit we encouraged continued compliance with valsartan  160 mg daily and added hydrochlorothiazide  12.5 mg daily.  He is here for follow-up today.  Since his last visit he is taking close valsartan  160 mg daily and hydrochlorothiazide  12.5 mg daily for hypertension.  Evaluated by cardiology on 09/06/2023 for chest pain in the setting of CAD.  During this visit he was initiated on aspirin  81 mg daily and coronary CT angiogram was ordered.  He underwent CT coronary angiogram on 09/11/2023 which showed coronary calcium  score of 151, 74 percentile.  Recommendations were for aggressive cardiac risk factor modification with LDL goal below 55.  His Crestor  was increased to 40 mg daily.   Today he's feeling well. He has noticed lightheadedness when rising from a squatted position. It is not bothersome. He is not checking his BP at home.   He has numerous questions regarding his recent CT coronary angiogram. He was not called to discuss his results in detail. He has yet to increase his cholesterol medication due to his questions.   BP Readings from Last 3 Encounters:  09/24/23 110/76  09/11/23 136/79  09/06/23 130/82      Review of Systems  Respiratory:  Negative for shortness of breath.   Cardiovascular:  Negative for chest pain and leg swelling.  Neurological:  Positive for light-headedness.         Past Medical History:  Diagnosis Date   Asthma    Diverticulitis    GERD (gastroesophageal reflux  disease)    Herpes zoster without complication 10/01/2018   Hyperlipidemia    Hypertension    Migraines    Shingles    Smoker 06/27/2012    Social History   Socioeconomic History   Marital status: Married    Spouse name: Not on file   Number of children: Not on file   Years of education: Not on file   Highest education level: Not on file  Occupational History   Not on file  Tobacco Use   Smoking status: Former    Current packs/day: 0.00    Average packs/day: 0.5 packs/day for 43.0 years (21.5 ttl pk-yrs)    Types: Cigarettes    Start date: 05/16/1978    Quit date: 05/15/2021    Years since quitting: 2.3   Smokeless tobacco: Current    Types: Chew  Substance and Sexual Activity   Alcohol use: No   Drug use: No   Sexual activity: Yes    Birth control/protection: None  Other Topics Concern   Not on file  Social History Narrative   Married.   3 children.   Works in Field seismologist.   Enjoys spending time outdoors.   Social Drivers of Corporate investment banker Strain: Not on file  Food Insecurity: Not on file  Transportation Needs: Not on file  Physical Activity: Not on file  Stress: Not on file  Social Connections: Not on file  Intimate Partner Violence: Not on file    Past Surgical  History:  Procedure Laterality Date   COLONOSCOPY WITH PROPOFOL  N/A 02/02/2021   Procedure: COLONOSCOPY WITH PROPOFOL ;  Surgeon: Jinny Carmine, MD;  Location: [facility] ENDOSCOPY;  Service: Endoscopy;  Laterality: N/A;   DG GALL BLADDER  1981    Family History  Problem Relation Age of Onset   Arthritis Mother    Hearing loss Mother    Heart disease Mother    Hypertension Father    Alcohol abuse Father    Cancer Father        bladder/kidney   Hearing loss Father    Hyperlipidemia Father     Allergies  Allergen Reactions   Lipitor [Atorvastatin ] Other (See Comments)    Myalgias, weakness (20 mg)    Current Outpatient Medications on File Prior to Visit  Medication Sig Dispense Refill    aspirin  EC 81 MG tablet Take 1 tablet (81 mg total) by mouth daily. Swallow whole.     hydrochlorothiazide  (HYDRODIURIL ) 12.5 MG tablet Take 1 tablet (12.5 mg total) by mouth daily. for blood pressure. 90 tablet 0   valsartan  (DIOVAN ) 160 MG tablet TAKE 1 TABLET (160 MG TOTAL) BY MOUTH DAILY. FOR BLOOD PRESSURE. 90 tablet 0   albuterol  (VENTOLIN  HFA) 108 (90 Base) MCG/ACT inhaler USE 2 INHALATIONS BY MOUTH INTO  THE LUNGS EVERY 6 HOURS AS  NEEDED FOR WHEEZING OR SHORTNESS OF BREATH (Patient not taking: Reported on 09/24/2023) 17 g 0   No current facility-administered medications on file prior to visit.    BP 110/76 (BP Location: Left Arm, Patient Position: Sitting, Cuff Size: Large)   Pulse 76   Temp 98 F (36.7 C) (Temporal)   Ht 5' 11 (1.803 m)   Wt 209 lb (94.8 kg)   SpO2 100%   BMI 29.15 kg/m  Objective:   Physical Exam Cardiovascular:     Rate and Rhythm: Normal rate and regular rhythm.  Pulmonary:     Effort: Pulmonary effort is normal.     Breath sounds: Normal breath sounds.  Musculoskeletal:     Cervical back: Neck supple.  Skin:    General: Skin is warm and dry.  Neurological:     Mental Status: He is alert and oriented to person, place, and time.  Psychiatric:        Mood and Affect: Mood normal.     Physical Exam        Assessment & Plan:  Essential hypertension Assessment & Plan: Controlled.  Continue valsartan  160 mg daily, hydrochlorothiazide  12.5 mg daily.  We discussed that if his lightheadedness became problematic to notify me.  At that point we would reduce losartan  to 80 mg daily. BMP pending.  Orders: -     Basic metabolic panel with GFR  Hyperlipidemia, unspecified hyperlipidemia type Assessment & Plan: Increase rosuvastatin  to 40 mg daily given recent CT coronary morphology results. LDL goal of less than 55.  Repeat lipids in 2 months.  Orders: -     Rosuvastatin  Calcium ; Take 1 tablet (40 mg total) by mouth daily. for  cholesterol.  Dispense: 90 tablet; Refill: 1  Coronary artery disease involving native heart without angina pectoris, unspecified vessel or lesion type Assessment & Plan: Minimal. Reviewed CT coronary morphology from August 2025 with patient in depth today.  All questions answered.  Increase rosuvastatin  to 40 mg for LDL goal below 55.  New prescription sent to pharmacy. Repeat lipid panel in 2 months.     Assessment and Plan Assessment & Plan  Zarin Hagmann K Dakota Stangl, NP     History of Present Illness

## 2023-09-24 NOTE — Patient Instructions (Signed)
 Stop by the lab prior to leaving today. I will notify you of your results once received.   Continue taking valsartan  160 mg tablets once daily for blood pressure and hydrochlorothiazide  12.5 mg once daily for blood pressure.  Schedule a lab only appointment for 2 months to repeat your cholesterol.  Please schedule a physical to meet with me in 4 months.   It was a pleasure to see you today!

## 2023-09-25 ENCOUNTER — Ambulatory Visit: Payer: Self-pay | Admitting: Primary Care

## 2023-09-25 LAB — BASIC METABOLIC PANEL WITH GFR
BUN/Creatinine Ratio: 13 (ref 10–24)
BUN: 14 mg/dL (ref 8–27)
CO2: 27 mmol/L (ref 20–29)
Calcium: 9.4 mg/dL (ref 8.6–10.2)
Chloride: 104 mmol/L (ref 96–106)
Creatinine, Ser: 1.06 mg/dL (ref 0.76–1.27)
Glucose: 67 mg/dL — ABNORMAL LOW (ref 70–99)
Potassium: 3.9 mmol/L (ref 3.5–5.2)
Sodium: 144 mmol/L (ref 134–144)
eGFR: 80 mL/min/1.73 (ref >59)

## 2023-11-18 ENCOUNTER — Encounter: Payer: Self-pay | Admitting: Radiology

## 2023-11-19 ENCOUNTER — Other Ambulatory Visit: Payer: Self-pay | Admitting: Primary Care

## 2023-11-19 DIAGNOSIS — I2510 Atherosclerotic heart disease of native coronary artery without angina pectoris: Secondary | ICD-10-CM

## 2023-11-19 DIAGNOSIS — E785 Hyperlipidemia, unspecified: Secondary | ICD-10-CM

## 2023-11-25 ENCOUNTER — Other Ambulatory Visit (INDEPENDENT_AMBULATORY_CARE_PROVIDER_SITE_OTHER)

## 2023-11-25 DIAGNOSIS — E785 Hyperlipidemia, unspecified: Secondary | ICD-10-CM

## 2023-11-25 DIAGNOSIS — I2510 Atherosclerotic heart disease of native coronary artery without angina pectoris: Secondary | ICD-10-CM | POA: Diagnosis not present

## 2023-11-26 ENCOUNTER — Ambulatory Visit: Payer: Self-pay | Admitting: Primary Care

## 2023-11-26 LAB — LIPID PANEL
Chol/HDL Ratio: 2.5 ratio (ref 0.0–5.0)
Cholesterol, Total: 122 mg/dL (ref 100–199)
HDL: 48 mg/dL (ref >39)
LDL Chol Calc (NIH): 56 mg/dL (ref 0–99)
Triglycerides: 97 mg/dL (ref 0–149)
VLDL Cholesterol Cal: 18 mg/dL (ref 5–40)

## 2023-12-03 ENCOUNTER — Other Ambulatory Visit: Payer: Self-pay | Admitting: Primary Care

## 2023-12-03 DIAGNOSIS — I10 Essential (primary) hypertension: Secondary | ICD-10-CM

## 2023-12-08 IMAGING — CT CT CHEST LUNG CANCER SCREENING LOW DOSE W/O CM
1 series · 10 of 10 positions shown, 13 images · non-contrast
Comparison: None.

CLINICAL DATA: Lung cancer screening. Current smoker with
pack-year history.



[ct lung segmentation data · axial · 0.70mm/px · z∈[+975,+975]mm · 10 of 299 frames shown]
[frame 1/299  mediastinal]
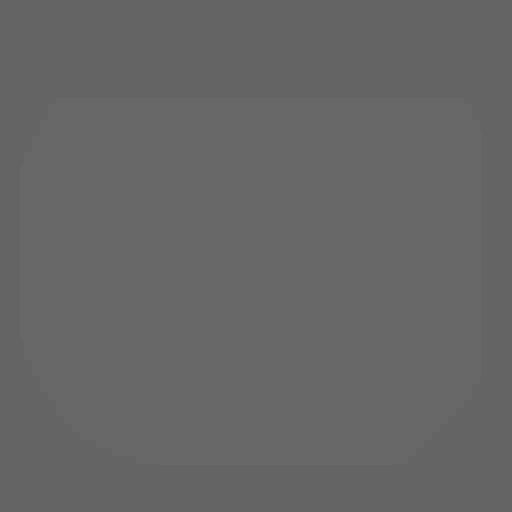
[frame 1/299  lung]
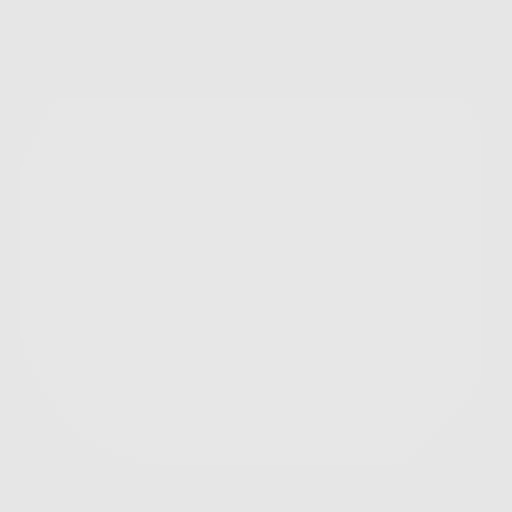
[frame 34/299  lung]
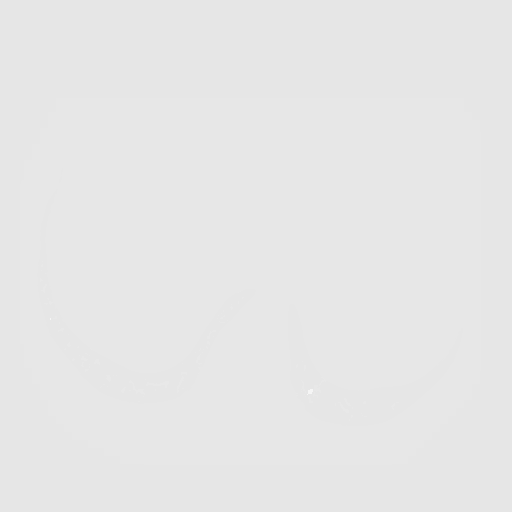
[frame 67/299  lung]
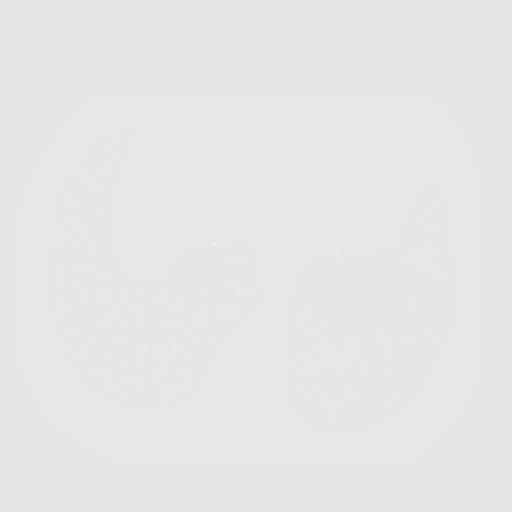
[frame 100/299  lung]
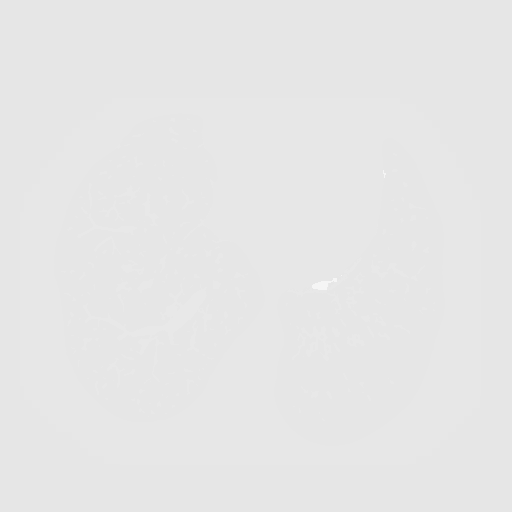
[frame 133/299  mediastinal]
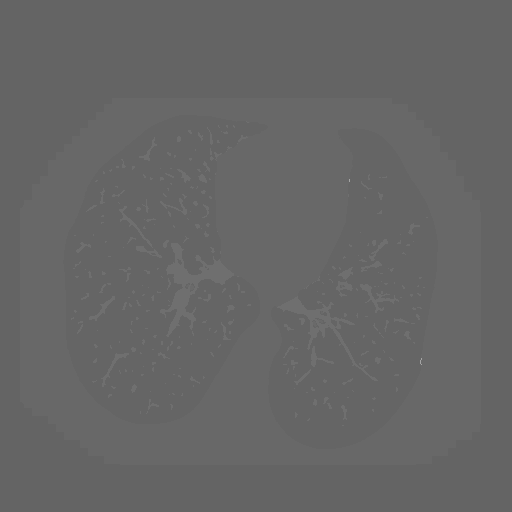
[frame 133/299  lung]
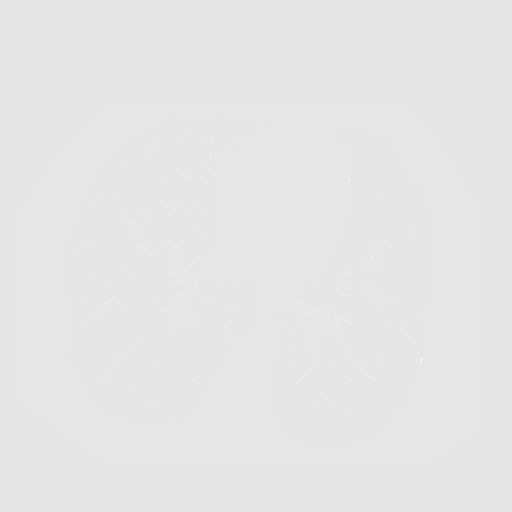
[frame 166/299  lung]
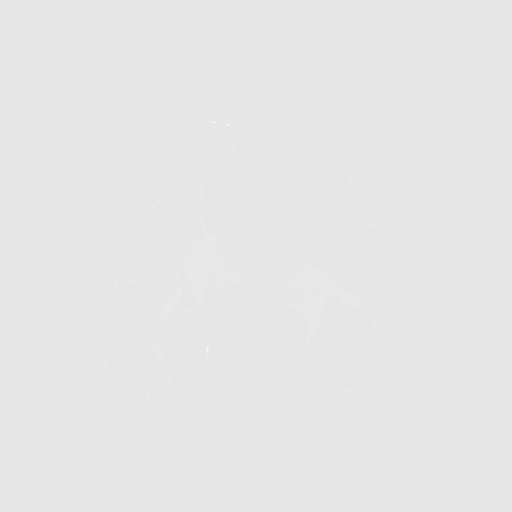
[frame 199/299  lung]
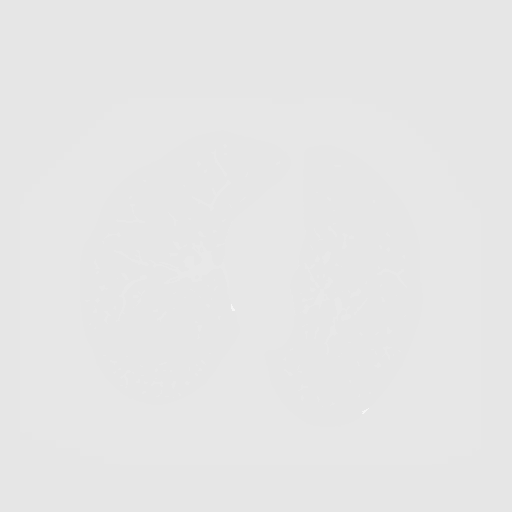
[frame 232/299  lung]
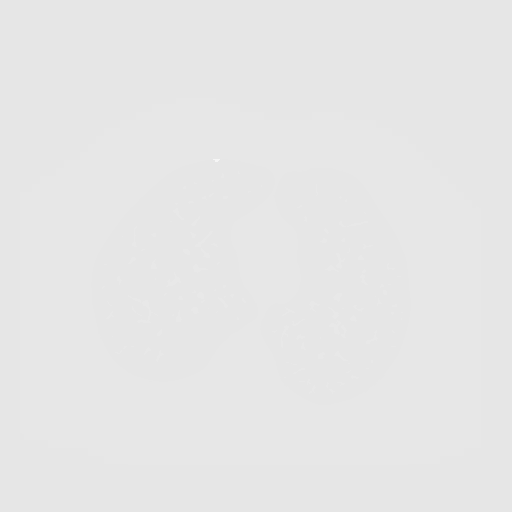
[frame 265/299  mediastinal]
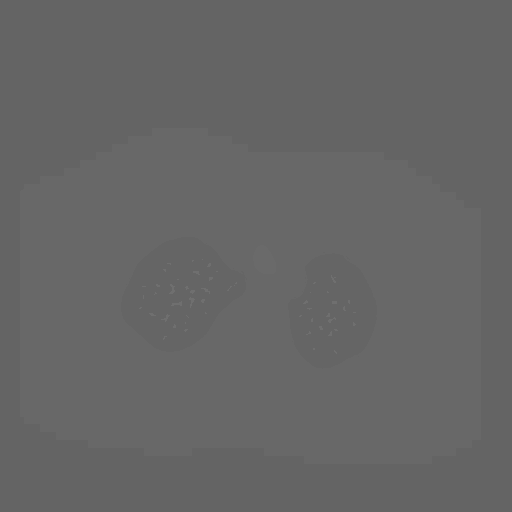
[frame 265/299  lung]
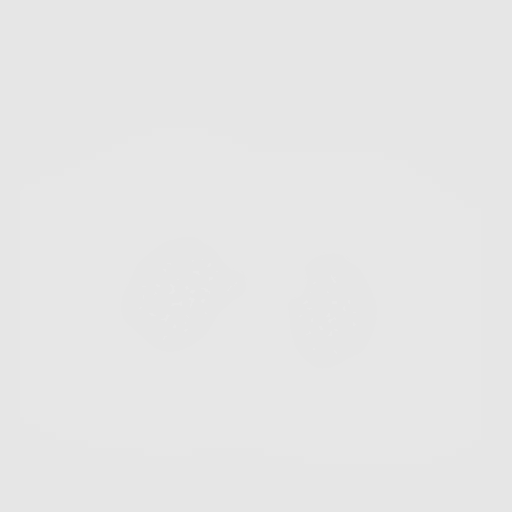
[frame 299/299  lung]
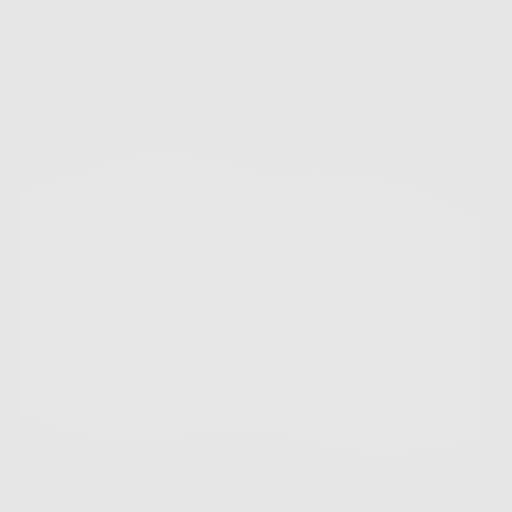

[10 of 10 positions shown; findings below may reference images not displayed]

FINDINGS: Cardiovascular: Heart size is normal. No pericardial effusion. Lad
coronary artery calcification noted.

Mediastinum/Nodes: No enlarged mediastinal, hilar, or axillary lymph
nodes. Thyroid gland, trachea, and esophagus demonstrate no
significant findings.

Lungs/Pleura: No pleural effusion, airspace consolidation, or
atelectasis. Mild centrilobular emphysema with diffuse bronchial
wall thickening. Bronchiectasis is identified within the lingula,
left lower lobe and right middle lobe. Small scattered lung nodules
are identified. The largest is in the inferior lingula with a mean
derived diameter of 7.8 mm, which is favored to represent an area of
mucous plugging within a dilated peripheral bronchial.

Upper Abdomen: No acute abnormality.  Cholecystectomy.

Musculoskeletal: Spondylosis identified within the thoracic spine.
No acute or suspicious osseous findings.
IMPRESSION: 1. Lung-RADS 3, probably benign findings. Short-term follow-up in 6
months is recommended with repeat low-dose chest CT without contrast
(please use the following order, "CT CHEST LCS NODULE FOLLOW-UP W/O
CM").
2. Diffuse bronchial wall thickening with emphysema, as above;
imaging findings suggestive of underlying COPD.
3. Coronary artery calcifications.
4. Emphysema ([id number]).

## 2024-01-21 ENCOUNTER — Other Ambulatory Visit: Payer: Self-pay | Admitting: Primary Care

## 2024-01-21 DIAGNOSIS — E785 Hyperlipidemia, unspecified: Secondary | ICD-10-CM

## 2024-01-22 ENCOUNTER — Other Ambulatory Visit: Payer: Self-pay | Admitting: Medical Genetics

## 2024-01-22 DIAGNOSIS — I10 Essential (primary) hypertension: Secondary | ICD-10-CM

## 2024-01-22 DIAGNOSIS — E785 Hyperlipidemia, unspecified: Secondary | ICD-10-CM

## 2024-01-24 MED ORDER — ROSUVASTATIN CALCIUM 40 MG PO TABS: 40.0000 mg | ORAL_TABLET | Freq: Every day | ORAL | 1 refills | Status: AC

## 2024-01-28 ENCOUNTER — Encounter: Payer: Self-pay | Admitting: Primary Care

## 2024-01-28 ENCOUNTER — Ambulatory Visit: Admitting: Primary Care

## 2024-01-28 VITALS — BP 166/90 | HR 56 | Temp 97.9°F | Ht 70.0 in | Wt 209.5 lb

## 2024-01-28 DIAGNOSIS — I10 Essential (primary) hypertension: Secondary | ICD-10-CM | POA: Diagnosis not present

## 2024-01-28 DIAGNOSIS — E785 Hyperlipidemia, unspecified: Secondary | ICD-10-CM | POA: Diagnosis not present

## 2024-01-28 DIAGNOSIS — Z0001 Encounter for general adult medical examination with abnormal findings: Secondary | ICD-10-CM | POA: Diagnosis not present

## 2024-01-28 DIAGNOSIS — R7303 Prediabetes: Secondary | ICD-10-CM | POA: Diagnosis not present

## 2024-01-28 DIAGNOSIS — R4189 Other symptoms and signs involving cognitive functions and awareness: Secondary | ICD-10-CM | POA: Diagnosis not present

## 2024-01-28 DIAGNOSIS — Z125 Encounter for screening for malignant neoplasm of prostate: Secondary | ICD-10-CM | POA: Diagnosis not present

## 2024-01-28 DIAGNOSIS — I2510 Atherosclerotic heart disease of native coronary artery without angina pectoris: Secondary | ICD-10-CM | POA: Diagnosis not present

## 2024-01-28 DIAGNOSIS — Z23 Encounter for immunization: Secondary | ICD-10-CM | POA: Diagnosis not present

## 2024-01-28 DIAGNOSIS — R519 Headache, unspecified: Secondary | ICD-10-CM | POA: Diagnosis not present

## 2024-01-28 NOTE — Assessment & Plan Note (Signed)
 Evaluated by cardiology. Reviewed office notes from August 2025.  Continue aspirin  81 mg daily. Working to control BP.

## 2024-01-28 NOTE — Assessment & Plan Note (Signed)
 Controlled.  Continue rosuvastatin  40 mg daily. Reviewed labs from November 2025.

## 2024-01-28 NOTE — Addendum Note (Signed)
 Addended by: ISADORA RAISIN on: 01/28/2024 08:22 AM   Modules accepted: Orders

## 2024-01-28 NOTE — Patient Instructions (Signed)
 Stop by the lab prior to leaving today. I will notify you of your results once received.   You will receive a phone call regarding the MRI of your brain.   Start monitoring your blood pressure daily, around the same time of day, for the next 1-2 weeks.  Ensure that you have rested for 30 minutes prior to checking your blood pressure.   Record your readings and notify me if you see numbers consistently at or above 130 on top and/or 90 on bottom.  It was a pleasure to see you today!

## 2024-01-28 NOTE — Progress Notes (Signed)
 "  Subjective:    Patient ID: Noah Wright, male    DOB: 28-Nov-1965, 59 y.o.   MRN: [id number]  Noah Wright is a very pleasant 59 y.o. male who presents today for complete physical and follow up of chronic conditions.  He would also like to discuss cognitive changes. Chronic over the last 1+ year, forgets conversations that he and his wife have had earlier that day. He does have difficulty with remembering roads and directions, also will mix up words. He has a family history of dementia in his mother. He denies unilateral weakness, dizziness, slurred speech. He is a prior smoker. He has a chronic history of headaches. He does not check his BP at home.   Immunizations: -Tetanus: Completed in 2020 -Influenza:  -Shingles: Completed Shingrix  series   Diet: Fair diet.  Exercise: No regular exercise.  Eye exam: Completes annually  Dental exam: Completes semi-annually    Colonoscopy: Completed in 2023, due 2033 Lung Cancer Screening: Completed in February 2025, scheduled for January 2026  PSA: Due   BP Readings from Last 3 Encounters:  01/28/24 (!) 166/90  09/24/23 110/76  09/11/23 136/79        Review of Systems  Constitutional:  Negative for unexpected weight change.  HENT:  Negative for rhinorrhea.   Respiratory:  Negative for cough and shortness of breath.   Cardiovascular:  Negative for chest pain.  Gastrointestinal:  Negative for constipation and diarrhea.  Genitourinary:  Negative for difficulty urinating.  Musculoskeletal:  Negative for arthralgias and myalgias.  Skin:  Negative for rash.  Allergic/Immunologic: Negative for environmental allergies.  Neurological:  Positive for headaches. Negative for dizziness.       See HPI  Psychiatric/Behavioral:  The patient is not nervous/anxious.          Past Medical History:  Diagnosis Date   Asthma    Diverticulitis    GERD (gastroesophageal reflux disease)    Herpes zoster without complication 10/01/2018    Hyperlipidemia    Hypertension    Migraines    Shingles    Smoker 06/27/2012    Social History   Socioeconomic History   Marital status: Married    Spouse name: Not on file   Number of children: Not on file   Years of education: Not on file   Highest education level: Not on file  Occupational History   Not on file  Tobacco Use   Smoking status: Former    Current packs/day: 0.00    Average packs/day: 0.5 packs/day for 43.0 years (21.5 ttl pk-yrs)    Types: Cigarettes    Start date: 05/16/1978    Quit date: 05/15/2021    Years since quitting: 2.7   Smokeless tobacco: Current    Types: Chew  Substance and Sexual Activity   Alcohol use: No   Drug use: No   Sexual activity: Yes    Birth control/protection: None  Other Topics Concern   Not on file  Social History Narrative   Married.   3 children.   Works in Field Seismologist.   Enjoys spending time outdoors.   Social Drivers of Health   Tobacco Use: High Risk (01/28/2024)   Patient History    Smoking Tobacco Use: Former    Smokeless Tobacco Use: Current    Passive Exposure: Not on Actuary Strain: Not on file  Food Insecurity: Not on file  Transportation Needs: Not on file  Physical Activity: Not on file  Stress: Not on file  Social Connections: Not on file  Intimate Partner Violence: Not on file  Depression [phone]): Low Risk (01/28/2024)   Depression (PHQ2-9)    PHQ-2 Score: 4  Alcohol Screen: Not on file  Housing: Not on file  Utilities: Not on file  Health Literacy: Not on file    Past Surgical History:  Procedure Laterality Date   COLONOSCOPY WITH PROPOFOL  N/A 02/02/2021   Procedure: COLONOSCOPY WITH PROPOFOL ;  Surgeon: Jinny Carmine, MD;  Location: [facility] ENDOSCOPY;  Service: Endoscopy;  Laterality: N/A;   DG GALL BLADDER  1981    Family History  Problem Relation Age of Onset   Arthritis Mother    Hearing loss Mother    Heart disease Mother    Hypertension Father    Alcohol abuse Father     Cancer Father        bladder/kidney   Hearing loss Father    Hyperlipidemia Father     Allergies[1]  Medications Ordered Prior to Encounter[2]  BP (!) 166/90   Pulse (!) 56   Temp 97.9 F (36.6 C) (Oral)   Ht 5' 10 (1.778 m)   Wt 209 lb 8 oz (95 kg)   SpO2 99%   BMI 30.06 kg/m  Objective:   Physical Exam HENT:     Right Ear: Tympanic membrane and ear canal normal.     Left Ear: Tympanic membrane and ear canal normal.  Eyes:     Pupils: Pupils are equal, round, and reactive to light.  Cardiovascular:     Rate and Rhythm: Normal rate and regular rhythm.  Pulmonary:     Effort: Pulmonary effort is normal.     Breath sounds: Normal breath sounds.  Abdominal:     General: Bowel sounds are normal.     Palpations: Abdomen is soft.     Tenderness: There is no abdominal tenderness.  Musculoskeletal:        General: Normal range of motion.     Cervical back: Neck supple.  Skin:    General: Skin is warm and dry.  Neurological:     Mental Status: He is alert and oriented to person, place, and time.     Cranial Nerves: No cranial nerve deficit.     Deep Tendon Reflexes:     Reflex Scores:      Patellar reflexes are 2+ on the right side and 2+ on the left side. Psychiatric:        Mood and Affect: Mood normal.     Physical Exam        Assessment & Plan:  Encounter for annual general medical examination with abnormal findings in adult Assessment & Plan: Immunizations UTD. Influenza vaccine provided today.  Colonoscopy UTD, due 2033 PSA due and pending.  Discussed the importance of a healthy diet and regular exercise in order for weight loss, and to reduce the risk of further co-morbidity.  Exam stable. Labs pending.  Follow up in 1 year for repeat physical.    Coronary artery disease involving native heart without angina pectoris, unspecified vessel or lesion type Assessment & Plan: Evaluated by cardiology. Reviewed office notes from August  2025.  Continue aspirin  81 mg daily. Working to control BP.    Prediabetes Assessment & Plan: Repeat A1C pending.  Work on a healthy diet and regular exercise in order for weight loss, and to reduce the risk of further co-morbidity.   Orders: -     Hemoglobin A1c  Hyperlipidemia, unspecified hyperlipidemia type Assessment & Plan: Controlled.  Continue rosuvastatin  40 mg daily. Reviewed labs from November 2025.  Orders: -     Hepatic function panel  Frequent headaches Assessment & Plan: Ongoing.  MRI brain ordered and pending.  Orders: -     MR BRAIN W WO CONTRAST; Future  Cognitive changes Assessment & Plan: Reported by patient.   Neuro exam today unremarkable.  Will start with MRI brain given chronic headache history. He agrees.   Consider neurology evaluation.   Orders: -     MR BRAIN W WO CONTRAST; Future  Essential hypertension Assessment & Plan: Above goal today, also on recheck. Historically his blood pressure is well-managed on his regimen.  I have asked that he start monitoring his blood pressure at home and report blood pressure readings in 1 to 2 weeks.  Continue valsartan  160 mg daily, consider dose adjustment to 320 mg. Continue hydrochlorothiazide  12.5 mg daily  Reviewed labs from September 2025   Screening for prostate cancer -     PSA  Need for influenza vaccination -     Flu vaccine trivalent PF, 6mos and older(Flulaval,Afluria,Fluarix,Fluzone)    Assessment and Plan Assessment & Plan         Comer MARLA Gaskins, NP       [1]  Allergies Allergen Reactions   Lipitor [Atorvastatin ] Other (See Comments)    Myalgias, weakness (20 mg)  [2]  Current Outpatient Medications on File Prior to Visit  Medication Sig Dispense Refill   albuterol  (VENTOLIN  HFA) 108 (90 Base) MCG/ACT inhaler USE 2 INHALATIONS BY MOUTH INTO  THE LUNGS EVERY 6 HOURS AS  NEEDED FOR WHEEZING OR SHORTNESS OF BREATH 17 g 0   aspirin  EC 81 MG tablet  Take 1 tablet (81 mg total) by mouth daily. Swallow whole.     hydrochlorothiazide  (HYDRODIURIL ) 12.5 MG tablet TAKE 1 TABLET BY MOUTH EVERY DAY FOR BLOOD PRESSURE 90 tablet 0   rosuvastatin  (CRESTOR ) 40 MG tablet Take 1 tablet (40 mg total) by mouth daily. for cholesterol. 90 tablet 1   valsartan  (DIOVAN ) 160 MG tablet TAKE 1 TABLET (160 MG TOTAL) BY MOUTH DAILY. FOR BLOOD PRESSURE. 90 tablet 0   No current facility-administered medications on file prior to visit.   "

## 2024-01-28 NOTE — Assessment & Plan Note (Signed)
 Immunizations UTD. Influenza vaccine provided today.  Colonoscopy UTD, due 2033 PSA due and pending.  Discussed the importance of a healthy diet and regular exercise in order for weight loss, and to reduce the risk of further co-morbidity.  Exam stable. Labs pending.  Follow up in 1 year for repeat physical.

## 2024-01-28 NOTE — Assessment & Plan Note (Signed)
 Above goal today, also on recheck. Historically his blood pressure is well-managed on his regimen.  I have asked that he start monitoring his blood pressure at home and report blood pressure readings in 1 to 2 weeks.  Continue valsartan  160 mg daily, consider dose adjustment to 320 mg. Continue hydrochlorothiazide  12.5 mg daily  Reviewed labs from September 2025

## 2024-01-28 NOTE — Assessment & Plan Note (Signed)
 Ongoing.  MRI brain ordered and pending.

## 2024-01-28 NOTE — Assessment & Plan Note (Signed)
 Reported by patient.   Neuro exam today unremarkable.  Will start with MRI brain given chronic headache history. He agrees.   Consider neurology evaluation.

## 2024-01-28 NOTE — Assessment & Plan Note (Signed)
 Repeat A1C pending.  Work on a healthy diet and regular exercise in order for weight loss, and to reduce the risk of further co-morbidity.

## 2024-01-29 ENCOUNTER — Ambulatory Visit: Payer: Self-pay | Admitting: Primary Care

## 2024-01-29 LAB — HEMOGLOBIN A1C
Est. average glucose Bld gHb Est-mCnc: 120 mg/dL
Hgb A1c MFr Bld: 5.8 % — ABNORMAL HIGH (ref 4.8–5.6)

## 2024-01-29 LAB — HEPATIC FUNCTION PANEL
ALT: 20 IU/L (ref 0–44)
AST: 19 IU/L (ref 0–40)
Albumin: 4.4 g/dL (ref 3.8–4.9)
Alkaline Phosphatase: 57 IU/L (ref 47–123)
Bilirubin Total: 0.5 mg/dL (ref 0.0–1.2)
Bilirubin, Direct: 0.16 mg/dL (ref 0.00–0.40)
Total Protein: 7.1 g/dL (ref 6.0–8.5)

## 2024-01-29 LAB — PSA: Prostate Specific Ag, Serum: 2.2 ng/mL (ref 0.0–4.0)

## 2024-01-30 MED ORDER — VALSARTAN 160 MG PO TABS: 160.0000 mg | ORAL_TABLET | Freq: Every day | ORAL | 1 refills | Status: AC

## 2024-02-14 ENCOUNTER — Ambulatory Visit
Admission: RE | Admit: 2024-02-14 | Discharge: 2024-02-14 | Disposition: A | Source: Ambulatory Visit | Attending: Acute Care | Admitting: Acute Care

## 2024-02-14 DIAGNOSIS — Z122 Encounter for screening for malignant neoplasm of respiratory organs: Secondary | ICD-10-CM

## 2024-02-14 DIAGNOSIS — Z87891 Personal history of nicotine dependence: Secondary | ICD-10-CM

## 2024-02-21 ENCOUNTER — Other Ambulatory Visit: Payer: Self-pay

## 2024-02-21 DIAGNOSIS — Z122 Encounter for screening for malignant neoplasm of respiratory organs: Secondary | ICD-10-CM

## 2024-02-21 DIAGNOSIS — Z87891 Personal history of nicotine dependence: Secondary | ICD-10-CM

## 2024-02-27 ENCOUNTER — Other Ambulatory Visit
